# Patient Record
Sex: Female | Born: 1985 | Race: White | Hispanic: No | Marital: Single | Smoking: Current every day smoker
Health system: Southern US, Community
[De-identification: ages and names within clinical notes are randomized; demographics above are authoritative.]

## PROBLEM LIST (undated history)

## (undated) DIAGNOSIS — G709 Myoneural disorder, unspecified: Secondary | ICD-10-CM

## (undated) DIAGNOSIS — K219 Gastro-esophageal reflux disease without esophagitis: Secondary | ICD-10-CM

## (undated) DIAGNOSIS — E8801 Alpha-1-antitrypsin deficiency: Secondary | ICD-10-CM

## (undated) HISTORY — PX: TUBAL LIGATION: SHX77

## (undated) HISTORY — PX: CHOLECYSTECTOMY: SHX55

## (undated) HISTORY — PX: INDUCED ABORTION: SHX677

## (undated) HISTORY — PX: TONSILLECTOMY: SUR1361

---
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
## 2005-01-07 ENCOUNTER — Emergency Department: Payer: Self-pay | Admitting: Emergency Medicine

## 2005-08-15 ENCOUNTER — Emergency Department: Payer: Self-pay | Admitting: Emergency Medicine

## 2005-09-14 ENCOUNTER — Emergency Department: Payer: Self-pay | Admitting: Emergency Medicine

## 2005-11-05 ENCOUNTER — Emergency Department: Payer: Self-pay | Admitting: Emergency Medicine

## 2006-08-10 ENCOUNTER — Emergency Department: Payer: Self-pay

## 2006-09-19 ENCOUNTER — Emergency Department: Payer: Self-pay | Admitting: Emergency Medicine

## 2006-12-28 ENCOUNTER — Emergency Department: Payer: Self-pay | Admitting: Internal Medicine

## 2007-02-19 ENCOUNTER — Emergency Department: Payer: Self-pay | Admitting: Unknown Physician Specialty

## 2007-04-24 ENCOUNTER — Emergency Department: Payer: Self-pay | Admitting: Emergency Medicine

## 2007-06-15 ENCOUNTER — Emergency Department: Payer: Self-pay | Admitting: Emergency Medicine

## 2007-06-20 ENCOUNTER — Emergency Department: Payer: Self-pay | Admitting: Internal Medicine

## 2007-09-17 ENCOUNTER — Other Ambulatory Visit: Payer: Self-pay

## 2007-09-17 ENCOUNTER — Emergency Department: Payer: Self-pay | Admitting: Emergency Medicine

## 2009-10-09 ENCOUNTER — Emergency Department: Payer: Self-pay | Admitting: Emergency Medicine

## 2010-01-08 ENCOUNTER — Emergency Department: Payer: Self-pay | Admitting: Emergency Medicine

## 2010-02-28 ENCOUNTER — Emergency Department: Payer: Self-pay | Admitting: Emergency Medicine

## 2010-07-25 ENCOUNTER — Emergency Department: Payer: Self-pay | Admitting: Emergency Medicine

## 2010-11-28 ENCOUNTER — Ambulatory Visit: Payer: Self-pay | Admitting: Emergency Medicine

## 2010-12-04 ENCOUNTER — Ambulatory Visit: Payer: Self-pay | Admitting: Emergency Medicine

## 2010-12-09 LAB — PATHOLOGY REPORT

## 2011-02-14 ENCOUNTER — Emergency Department: Payer: Self-pay | Admitting: Emergency Medicine

## 2012-01-03 ENCOUNTER — Emergency Department: Payer: Self-pay | Admitting: Emergency Medicine

## 2012-04-25 ENCOUNTER — Emergency Department: Payer: Self-pay | Admitting: Emergency Medicine

## 2012-04-25 LAB — RAPID INFLUENZA A&B ANTIGENS

## 2012-09-13 ENCOUNTER — Emergency Department: Payer: Self-pay | Admitting: Emergency Medicine

## 2012-10-30 ENCOUNTER — Emergency Department: Payer: Self-pay | Admitting: Emergency Medicine

## 2015-01-15 ENCOUNTER — Emergency Department: Payer: Medicaid Other

## 2015-01-15 ENCOUNTER — Encounter: Payer: Self-pay | Admitting: Emergency Medicine

## 2015-01-15 ENCOUNTER — Emergency Department
Admission: EM | Admit: 2015-01-15 | Discharge: 2015-01-15 | Disposition: A | Discharge status: Home / Self Care | Payer: Medicaid Other | Attending: Emergency Medicine | Admitting: Emergency Medicine

## 2015-01-15 DIAGNOSIS — J159 Unspecified bacterial pneumonia: Secondary | ICD-10-CM | POA: Insufficient documentation

## 2015-01-15 DIAGNOSIS — J189 Pneumonia, unspecified organism: Secondary | ICD-10-CM

## 2015-01-15 DIAGNOSIS — R05 Cough: Secondary | ICD-10-CM | POA: Diagnosis present

## 2015-01-15 DIAGNOSIS — Z720 Tobacco use: Secondary | ICD-10-CM | POA: Insufficient documentation

## 2015-01-15 MED ORDER — AZITHROMYCIN 500 MG PO TABS: 500.0000 mg | ORAL_TABLET | Freq: Every day | ORAL | Status: AC

## 2015-01-15 MED ORDER — IPRATROPIUM-ALBUTEROL 0.5-2.5 (3) MG/3ML IN SOLN
3.0000 mL | Freq: Once | RESPIRATORY_TRACT | Status: AC
  Administered 2015-01-15: 3 mL via RESPIRATORY_TRACT
  Filled 2015-01-15: qty 3

## 2015-01-15 NOTE — Discharge Instructions (Signed)

## 2015-01-15 NOTE — ED Notes (Signed)
Pt to ed with c/o cough and congestion x 6 days.  Pt reports she has tried OTCs without relief.

## 2015-01-15 NOTE — ED Provider Notes (Signed)
[facility] Emergency Department Provider Note  ____________________________________________  Time seen: On arrival  I have reviewed the triage vital signs and the nursing notes.   HISTORY  Chief Complaint Cough    HPI Raven Christensen is a 29 y.o. female who complains of severe cough and congestion for 3-4 days. She reports night sweats . But has not checked her temperature. She reports at times his coughing is severe that it makes her nauseous. She denies shortness of breath. She does smoke cigarettes.     History reviewed. No pertinent past medical history.  There are no active problems to display for this patient.   History reviewed. No pertinent past surgical history.  Current Outpatient Rx  Name  Route  Sig  Dispense  Refill  . azithromycin (ZITHROMAX) 500 MG tablet   Oral   Take 1 tablet (500 mg total) by mouth daily. Take 1 tablet daily for 3 days.   7 tablet   0     Allergies Review of patient's allergies indicates no known allergies.  History reviewed. No pertinent family history.  Social History Social History  Substance Use Topics  . Smoking status: Current Every Day Smoker  . Smokeless tobacco: None  . Alcohol Use: No    Review of Systems  Constitutional: Negative for fever. Eyes: Negative for visual changes. ENT: Negative for sore throat Cardiovascular: Negative for chest pain. Respiratory: Negative for shortness of breath. Positive for cough Gastrointestinal: Negative for abdominal pain, vomiting and diarrhea. Genitourinary: Negative for dysuria. Musculoskeletal: Negative for back pain. Skin: Negative for rash. Neurological: Negative for headaches or focal weakness Psychiatric: No anxiety    ____________________________________________   PHYSICAL EXAM:  VITAL SIGNS: ED Triage Vitals  Enc Vitals Group     BP --      Pulse Rate 01/15/15 1418 96     Resp 01/15/15 1418 18     Temp 01/15/15 1418 98.2 F  (36.8 C)     Temp Source 01/15/15 1418 Oral     SpO2 01/15/15 1418 96 %     Weight 01/15/15 1418 234 lb (106.142 kg)     Height 01/15/15 1418  (1.702 m)     Head Cir --      Peak Flow --      Pain Score 01/15/15 1419 7     Pain Loc --      Pain Edu? --      Excl. in GC? --      Constitutional: Alert and oriented. Well appearing and in no distress. Eyes: Conjunctivae are normal.  ENT   Head: Normocephalic and atraumatic.   Mouth/Throat: Mucous membranes are moist. Cardiovascular: Normal rate, regular rhythm. Normal and symmetric distal pulses are present in all extremities. No murmurs, rubs, or gallops. Respiratory: Normal respiratory effort without tachypnea nor retractions. Breath sounds are clear and equal bilaterally.  Gastrointestinal: Soft and non-tender in all quadrants. No distention. There is no CVA tenderness. Genitourinary: deferred Musculoskeletal: Nontender with normal range of motion in all extremities. No lower extremity tenderness nor edema. Neurologic:  Normal speech and language. No gross focal neurologic deficits are appreciated. Skin:  Skin is warm, dry and intact. No rash noted. Psychiatric: Mood and affect are normal. Patient exhibits appropriate insight and judgment.  ____________________________________________    LABS (pertinent positives/negatives)  Labs Reviewed - No data to display  ____________________________________________   EKG  None  ____________________________________________    RADIOLOGY I have personally reviewed any xrays that were  ordered on this patient: Chest x-ray concerning for pneumonia  ____________________________________________   PROCEDURES  Procedure(s) performed: none  Critical Care performed: none  ____________________________________________   INITIAL IMPRESSION / ASSESSMENT AND PLAN / ED COURSE  Pertinent labs & imaging results that were available during my care of the patient were reviewed  by me and considered in my medical decision making (see chart for details).  Patient with significant cough and night sweats. She does have congestion which is worse just a viral etiology but given night sweats we will check a chest x-ray. In the meantime we will give her a DuoNeb for symptom relief.  Chest x-ray consistent with pneumonia. I will prescribe antibiotics for outpatient treatment given unremarkable vitals and nontoxic appearance    ____________________________________________   FINAL CLINICAL IMPRESSION(S) / ED DIAGNOSES  Final diagnoses:  Community acquired pneumonia     Jene Every, MD 01/15/15 [phone]

## 2015-08-14 ENCOUNTER — Encounter: Payer: Self-pay | Admitting: Emergency Medicine

## 2015-08-14 ENCOUNTER — Emergency Department
Admission: EM | Admit: 2015-08-14 | Discharge: 2015-08-14 | Disposition: A | Discharge status: Home / Self Care | Payer: Medicaid Other | Attending: Emergency Medicine | Admitting: Emergency Medicine

## 2015-08-14 DIAGNOSIS — Z9851 Tubal ligation status: Secondary | ICD-10-CM | POA: Insufficient documentation

## 2015-08-14 DIAGNOSIS — F17210 Nicotine dependence, cigarettes, uncomplicated: Secondary | ICD-10-CM | POA: Insufficient documentation

## 2015-08-14 DIAGNOSIS — R197 Diarrhea, unspecified: Secondary | ICD-10-CM | POA: Diagnosis present

## 2015-08-14 DIAGNOSIS — K529 Noninfective gastroenteritis and colitis, unspecified: Secondary | ICD-10-CM

## 2015-08-14 LAB — POCT PREGNANCY, URINE: Preg Test, Ur: NEGATIVE

## 2015-08-14 LAB — COMPREHENSIVE METABOLIC PANEL
ALBUMIN: 4 g/dL (ref 3.5–5.0)
ALK PHOS: 63 U/L (ref 38–126)
ALT: 58 U/L — ABNORMAL HIGH (ref 14–54)
ANION GAP: 7 (ref 5–15)
AST: 25 U/L (ref 15–41)
BILIRUBIN TOTAL: 0.2 mg/dL — AB (ref 0.3–1.2)
BUN: 7 mg/dL (ref 6–20)
CALCIUM: 8.6 mg/dL — AB (ref 8.9–10.3)
CO2: 29 mmol/L (ref 22–32)
Chloride: 106 mmol/L (ref 101–111)
Creatinine, Ser: 0.7 mg/dL (ref 0.44–1.00)
GFR calc Af Amer: >60 mL/min (ref >60)
GLUCOSE: 90 mg/dL (ref 65–99)
POTASSIUM: 3.7 mmol/L (ref 3.5–5.1)
Sodium: 142 mmol/L (ref 135–145)
TOTAL PROTEIN: 6.6 g/dL (ref 6.5–8.1)

## 2015-08-14 LAB — CBC
HCT: 39.8 % (ref 35.0–47.0)
Hemoglobin: 13.7 g/dL (ref 12.0–16.0)
MCH: 35 pg — ABNORMAL HIGH (ref 26.0–34.0)
MCHC: 34.4 g/dL (ref 32.0–36.0)
MCV: 101.7 fL — ABNORMAL HIGH (ref 80.0–100.0)
Platelets: 278 10*3/uL (ref 150–440)
RBC: 3.91 MIL/uL (ref 3.80–5.20)
RDW: 12.7 % (ref 11.5–14.5)
WBC: 7 10*3/uL (ref 3.6–11.0)

## 2015-08-14 LAB — URINALYSIS COMPLETE WITH MICROSCOPIC (ARMC ONLY)
BILIRUBIN URINE: NEGATIVE
GLUCOSE, UA: NEGATIVE mg/dL
HGB URINE DIPSTICK: NEGATIVE
KETONES UR: NEGATIVE mg/dL
LEUKOCYTES UA: NEGATIVE
NITRITE: NEGATIVE
PH: 8 (ref 5.0–8.0)
Protein, ur: NEGATIVE mg/dL
SPECIFIC GRAVITY, URINE: 1.01 (ref 1.005–1.030)

## 2015-08-14 LAB — LIPASE, BLOOD: LIPASE: 14 U/L (ref 11–51)

## 2015-08-14 NOTE — ED Provider Notes (Signed)
[facility] Emergency Department Provider Note  ____________________________________________  Time seen: Approximately 9:13 PM  I have reviewed the triage vital signs and the nursing notes.   HISTORY  Chief Complaint Diarrhea    HPI Raven Christensen is a 30 y.o. female with no specific past medical history other than obesity presents for evaluation of diarrhea that she states is been going on for at least 2 months.  She says that it occurs daily, consists of a couple of watery stools every day, and occasionally has mild lower abdominal cramping.  She sometimes has nausea but very rarely has vomiting.  She denies any upper abdominal pain.  She denies fever/chills, chest pain, shortness of breath, dysuria. She has a primary care doctor but has not seen them in about 4 years.  She has no family members with Crohn's disease or ulcerative colitis.  She came in today because she is just tired of it and has been feeling fatigued recently.  She has been taking Imodium but it is not helped.  She has also tried Pepto-Bismol but also with no positive results.  Overall she describes the symptoms as severe and nothing is making them better or worse.   History reviewed. No pertinent past medical history.  There are no active problems to display for this patient.   Past Surgical History  Procedure Laterality Date  . Induced abortion    . Tonsillectomy    . Tubal ligation      No current outpatient prescriptions on file.  Allergies Review of patient's allergies indicates no known allergies.  No family history on file.  Social History Social History  Substance Use Topics  . Smoking status: Current Every Day Smoker -- 0.50 packs/day    Types: Cigarettes  . Smokeless tobacco: None  . Alcohol Use: No    Review of Systems Constitutional: No fever/chills Eyes: No visual changes. ENT: No sore throat. Cardiovascular: Denies chest pain. Respiratory: Denies  shortness of breath. Gastrointestinal: No abdominal pain, occasional cramps.  No nausea, no vomiting.  +diarrhea.  No constipation. Genitourinary: Negative for dysuria. Musculoskeletal: Negative for back pain. Skin: Negative for rash. Neurological: Negative for headaches, focal weakness or numbness.  10-point ROS otherwise negative.  ____________________________________________   PHYSICAL EXAM:  VITAL SIGNS: ED Triage Vitals  Enc Vitals Group     BP 08/14/15 1910 128/68 mmHg     Pulse Rate 08/14/15 1910 89     Resp --      Temp 08/14/15 1910 98.3 F (36.8 C)     Temp Source 08/14/15 1910 Oral     SpO2 08/14/15 1910 100 %     Weight 08/14/15 1910 209 lb (94.802 kg)     Height 08/14/15 1910  (1.702 m)     Head Cir --      Peak Flow --      Pain Score 08/14/15 1912 5     Pain Loc --      Pain Edu? --      Excl. in GC? --     Constitutional: Alert and oriented. Well appearing and in no acute distress. Eyes: Conjunctivae are normal. PERRL. EOMI. Head: Atraumatic. Nose: No congestion/rhinnorhea. Mouth/Throat: Mucous membranes are moist.  Oropharynx non-erythematous. Neck: No stridor.  No meningeal signs.   Cardiovascular: Normal rate, regular rhythm. Good peripheral circulation. Grossly normal heart sounds.   Respiratory: Normal respiratory effort.  No retractions. Lungs CTAB. Gastrointestinal: Soft and nontender. No distention.  Musculoskeletal: No lower extremity  tenderness nor edema. No gross deformities of extremities. Neurologic:  Normal speech and language. No gross focal neurologic deficits are appreciated.  Skin:  Skin is warm, dry and intact. No rash noted.   ____________________________________________   LABS (all labs ordered are listed, but only abnormal results are displayed)  Labs Reviewed  COMPREHENSIVE METABOLIC PANEL - Abnormal; Notable for the following:    Calcium 8.6 (*)    ALT 58 (*)    Total Bilirubin 0.2 (*)    All other components  within normal limits  CBC - Abnormal; Notable for the following:    MCV 101.7 (*)    MCH 35.0 (*)    All other components within normal limits  URINALYSIS COMPLETEWITH MICROSCOPIC ([facility] ONLY) - Abnormal; Notable for the following:    Color, Urine YELLOW (*)    APPearance CLEAR (*)    Bacteria, UA RARE (*)    Squamous Epithelial / LPF 0-5 (*)    All other components within normal limits  LIPASE, BLOOD  POC URINE PREG, ED  POCT PREGNANCY, URINE   ____________________________________________  EKG  None ____________________________________________  RADIOLOGY   No results found.  ____________________________________________   PROCEDURES  Procedure(s) performed: None  Critical Care performed: No ____________________________________________   INITIAL IMPRESSION / ASSESSMENT AND PLAN / ED COURSE  Pertinent labs & imaging results that were available during my care of the patient were reviewed by me and considered in my medical decision making (see chart for details).  Benign physical exam and laboratory workup.  I explained to the patient that her symptoms are chronic and that I do not believe she would benefit from any antibiotics.  She also mentioned that she has not been traveling, camping, or had any unusual food exposures before everything began.  I recommended that she follow up with her primary care doctor and discuss the possible benefit of referral to gastroenterology, but explained that her symptoms are most consistent with irritable bowel syndrome.  I encouraged her to retrieve information are provided and follow-up with her PCP.  I gave my usual and customary return precautions.     ____________________________________________  FINAL CLINICAL IMPRESSION(S) / ED DIAGNOSES  Final diagnoses:  Chronic diarrhea      NEW MEDICATIONS STARTED DURING THIS VISIT:  New Prescriptions   No medications on file      Note:  This document was prepared using Dragon  voice recognition software and may include unintentional dictation errors.   Loleta Roseory Garlin Batdorf, MD 08/14/15 2129

## 2015-08-14 NOTE — Discharge Instructions (Signed)
As we discussed, your workup was reassuring today.  Given that this is a chronic issue, we recommend that you discuss with your primary care doctor and see if they have additional outpatient testing that they would like to pursue, or possibly refer you to a gastroenterologist after those options have been exhausted.  Please read through the information below for additional suggestions and recommendations.  Chronic Diarrhea Diarrhea is frequent loose and watery bowel movements. It can cause you to feel weak and dehydrated. Dehydration can cause you to become tired and thirsty and to have a dry mouth, decreased urination, and dark yellow urine. Diarrhea is a sign of another problem, most often an infection that will not last long. In most cases, diarrhea lasts 2-3 days. Diarrhea that lasts longer than 4 weeks is called long-lasting (chronic) diarrhea. It is important to treat your diarrhea as directed by your health care provider to lessen or prevent future episodes of diarrhea.  CAUSES  There are many causes of chronic diarrhea. The following are some possible causes:   Gastrointestinal infections caused by viruses, bacteria, or parasites.   Food poisoning or food allergies.   Certain medicines, such as antibiotics, chemotherapy, and laxatives.   Artificial sweeteners and fructose.   Digestive disorders, such as celiac disease and inflammatory bowel diseases.   Irritable bowel syndrome.  Some disorders of the pancreas.  Disorders of the thyroid.  Reduced blood flow to the intestines.  Cancer. Sometimes the cause of chronic diarrhea is unknown. RISK FACTORS  Having a severely weakened immune system, such as from HIV or AIDS.   Taking certain types of cancer-fighting drugs (such as with chemotherapy) or other medicines.   Having had a recent organ transplant.   Having a portion of the stomach or small bowel removed.   Traveling to countries where food and water supplies are  often contaminated.  SYMPTOMS  In addition to frequent, loose stools, diarrhea may cause:   Cramping.   Abdominal pain.   Nausea.   Fever.  Fatigue.  Urgent need to use the bathroom.  Loss of bowel control. DIAGNOSIS  Your health care provider must take a careful history and perform a physical exam. Tests given are based on your symptoms and history. Tests may include:   Blood or stool tests. Three or more stool samples may be examined. Stool cultures may be used to test for bacteria or parasites.   X-rays.   A procedure in which a thin tube is inserted into the mouth or rectum (endoscopy). This allows the health care provider to look inside the intestine.  TREATMENT   Treatment is aimed at correcting the cause of the diarrhea when possible.  Diarrhea caused by an infection can often be treated with antibiotic medicines.  Diarrhea not caused by an infection may require you to take long-term medicine or have surgery. Specific treatment should be discussed with your health care provider.  If the cause cannot be determined, treatment aims to relieve symptoms and prevent dehydration. Serious health problems can occur if you do not maintain proper fluid levels. Treatment may include:  Taking an oral rehydration solution (ORS).  Not drinking beverages that contain caffeine (such as tea, coffee, and soft drinks).  Not drinking alcohol.  Maintaining well-balanced nutrition to help you recover faster. HOME CARE INSTRUCTIONS   Drink enough fluids to keep urine clear or pale yellow. Drink 1 cup (8 oz) of fluid for each diarrhea episode. Avoid fluids that contain simple sugars, fruit juices, whole  milk products, and sodas. Hydrate with an ORS. You may purchase the ORS or prepare it at home by mixing the following ingredients together:   - tsp (1.7-3  mL) table salt.   tsp (3  mL) baking soda.   tsp (1.7 mL) salt substitute containing potassium chloride.  1 tbsp (20  mL) sugar.  4.2 c (1 L) of water.   Certain foods and beverages may increase the speed at which food moves through the gastrointestinal (GI) tract. These foods and beverages should be avoided. They include:  Caffeinated and alcoholic beverages.  High-fiber foods, such as raw fruits and vegetables, nuts, seeds, and whole grain breads and cereals.  Foods and beverages sweetened with sugar alcohols, such as xylitol, sorbitol, and mannitol.   Some foods may be well tolerated and may help thicken stool. These include:  Starchy foods, such as rice, toast, pasta, low-sugar cereal, oatmeal, grits, baked potatoes, crackers, and bagels.  Bananas.  Applesauce.  Add probiotic-rich foods to help increase healthy bacteria in the GI tract. These include yogurt and fermented milk products.  Wash your hands well after each diarrhea episode.  Only take over-the-counter or prescription medicines as directed by your health care provider.  Take a warm bath to relieve any burning or pain from frequent diarrhea episodes. SEEK MEDICAL CARE IF:   You are not urinating as often.  Your urine is a dark color.  You become very tired or dizzy.  You have severe pain in the abdomen or rectum.  Your have blood or pus in your stools.  Your stools look black and tarry. SEEK IMMEDIATE MEDICAL CARE IF:   You are unable to keep fluids down.  You have persistent vomiting.  You have blood in your stool.  Your stools are black and tarry.  You do not urinate in 6-8 hours, or there is only a small amount of very dark urine.  You have abdominal pain that increases or localizes.  You have weakness, dizziness, confusion, or lightheadedness.  You have a severe headache.  Your diarrhea gets worse or does not get better.  You have a fever or persistent symptoms for more than 2-3 days.  You have a fever and your symptoms suddenly get worse. MAKE SURE YOU:   Understand these instructions.  Will  watch your condition.  Will get help right away if you are not doing well or get worse.   This information is not intended to replace advice given to you by your health care provider. Make sure you discuss any questions you have with your health care provider.   Document Released: 07/04/2003 Document Revised: 04/18/2013 Document Reviewed: 10/06/2012 Elsevier Interactive Patient Education 2016 [employer].  Food Choices to Help Relieve Diarrhea, Adult When you have diarrhea, the foods you eat and your eating habits are very important. Choosing the right foods and drinks can help relieve diarrhea. Also, because diarrhea can last up to 7 days, you need to replace lost fluids and electrolytes (such as sodium, potassium, and chloride) in order to help prevent dehydration.  WHAT GENERAL GUIDELINES DO I NEED TO FOLLOW?  Slowly drink 1 cup (8 oz) of fluid for each episode of diarrhea. If you are getting enough fluid, your urine will be clear or pale yellow.  Eat starchy foods. Some good choices include white rice, white toast, pasta, low-fiber cereal, baked potatoes (without the skin), saltine crackers, and bagels.  Avoid large servings of any cooked vegetables.  Limit fruit to two servings per  day. A serving is  cup or 1 small piece.  Choose foods with less than 2 g of fiber per serving.  Limit fats to less than 8 tsp (38 g) per day.  Avoid fried foods.  Eat foods that have probiotics in them. Probiotics can be found in certain dairy products.  Avoid foods and beverages that may increase the speed at which food moves through the stomach and intestines (gastrointestinal tract). Things to avoid include:  High-fiber foods, such as dried fruit, raw fruits and vegetables, nuts, seeds, and whole grain foods.  Spicy foods and high-fat foods.  Foods and beverages sweetened with high-fructose corn syrup, honey, or sugar alcohols such as xylitol, sorbitol, and mannitol. WHAT FOODS ARE  RECOMMENDED? Grains White rice. White, [place], or pita breads (fresh or toasted), including plain rolls, buns, or bagels. White pasta. Saltine, soda, or graham crackers. Pretzels. Low-fiber cereal. Cooked cereals made with water (such as cornmeal, farina, or cream cereals). Plain muffins. Matzo. Melba toast. Zwieback.  Vegetables Potatoes (without the skin). Strained tomato and vegetable juices. Most well-cooked and canned vegetables without seeds. Tender lettuce. Fruits Cooked or canned applesauce, apricots, cherries, fruit cocktail, grapefruit, peaches, pears, or plums. Fresh bananas, apples without skin, cherries, grapes, cantaloupe, grapefruit, peaches, oranges, or plums.  Meat and Other Protein Products Baked or boiled chicken. Eggs. Tofu. Fish. Seafood. Smooth peanut butter. Ground or well-cooked tender beef, ham, veal, lamb, pork, or poultry.  Dairy Plain yogurt, kefir, and unsweetened liquid yogurt. Lactose-free milk, buttermilk, or soy milk. Plain hard cheese. Beverages Sport drinks. Clear broths. Diluted fruit juices (except prune). Regular, caffeine-free sodas such as ginger ale. Water. Decaffeinated teas. Oral rehydration solutions. Sugar-free beverages not sweetened with sugar alcohols. Other Bouillon, broth, or soups made from recommended foods.  The items listed above may not be a complete list of recommended foods or beverages. Contact your dietitian for more options. WHAT FOODS ARE NOT RECOMMENDED? Grains Whole grain, whole wheat, bran, or rye breads, rolls, pastas, crackers, and cereals. Wild or brown rice. Cereals that contain more than 2 g of fiber per serving. Corn tortillas or taco shells. Cooked or dry oatmeal. Granola. Popcorn. Vegetables Raw vegetables. Cabbage, broccoli, Brussels sprouts, artichokes, baked beans, beet greens, corn, kale, legumes, peas, sweet potatoes, and yams. Potato skins. Cooked spinach and cabbage. Fruits Dried fruit, including raisins and dates.  Raw fruits. Stewed or dried prunes. Fresh apples with skin, apricots, mangoes, pears, raspberries, and strawberries.  Meat and Other Protein Products Chunky peanut butter. Nuts and seeds. Beans and lentils. Tomasa Blase.  Dairy High-fat cheeses. Milk, chocolate milk, and beverages made with milk, such as milk shakes. Cream. Ice cream. Sweets and Desserts Sweet rolls, doughnuts, and sweet breads. Pancakes and waffles. Fats and Oils Butter. Cream sauces. Margarine. Salad oils. Plain salad dressings. Olives. Avocados.  Beverages Caffeinated beverages (such as coffee, tea, soda, or energy drinks). Alcoholic beverages. Fruit juices with pulp. Prune juice. Soft drinks sweetened with high-fructose corn syrup or sugar alcohols. Other Coconut. Hot sauce. Chili powder. Mayonnaise. Gravy. Cream-based or milk-based soups.  The items listed above may not be a complete list of foods and beverages to avoid. Contact your dietitian for more information. WHAT SHOULD I DO IF I BECOME DEHYDRATED? Diarrhea can sometimes lead to dehydration. Signs of dehydration include dark urine and dry mouth and skin. If you think you are dehydrated, you should rehydrate with an oral rehydration solution. These solutions can be purchased at pharmacies, retail stores, or online.   f oral  rehydration solution each time you have an episode of diarrhea. If drinking this amount makes your diarrhea worse, try drinking smaller amounts more often. For example, drink 1-3 tsp (5-15 mL) every 5-10 minutes.  A general rule for staying hydrated is to drink 1-2 L of fluid per day. Talk to your health care provider about the specific amount you should be drinking each day. Drink enough fluids to keep your urine clear or pale yellow.   This information is not intended to replace advice given to you by your health care provider. Make sure you discuss any questions you have with your health care provider.   Document Released: 07/04/2003 Document  Revised: 05/04/2014 Document Reviewed: 03/06/2013 Elsevier Interactive Patient Education [employer]2016 Elsevier Inc.

## 2015-08-14 NOTE — ED Notes (Signed)
Patient ambulatory to triage with steady gait, without difficulty or distress noted; pt reports diarrhea x 2 months with abd cramping; st increased episodes with meals

## 2016-04-24 ENCOUNTER — Emergency Department: Payer: Medicaid Other

## 2016-04-24 ENCOUNTER — Encounter: Payer: Self-pay | Admitting: Emergency Medicine

## 2016-04-24 ENCOUNTER — Emergency Department
Admission: EM | Admit: 2016-04-24 | Discharge: 2016-04-24 | Disposition: A | Discharge status: Home / Self Care | Payer: Medicaid Other | Attending: Emergency Medicine | Admitting: Emergency Medicine

## 2016-04-24 DIAGNOSIS — F17210 Nicotine dependence, cigarettes, uncomplicated: Secondary | ICD-10-CM | POA: Diagnosis not present

## 2016-04-24 DIAGNOSIS — J069 Acute upper respiratory infection, unspecified: Secondary | ICD-10-CM

## 2016-04-24 DIAGNOSIS — Z79899 Other long term (current) drug therapy: Secondary | ICD-10-CM | POA: Insufficient documentation

## 2016-04-24 DIAGNOSIS — R0602 Shortness of breath: Secondary | ICD-10-CM | POA: Diagnosis present

## 2016-04-24 MED ORDER — IPRATROPIUM-ALBUTEROL 0.5-2.5 (3) MG/3ML IN SOLN
RESPIRATORY_TRACT | Status: AC
  Administered 2016-04-24: 3 mL
  Filled 2016-04-24: qty 3

## 2016-04-24 MED ORDER — PREDNISONE 50 MG PO TABS: 50.0000 mg | ORAL_TABLET | Freq: Every day | ORAL | 0 refills | Status: DC

## 2016-04-24 MED ORDER — IPRATROPIUM-ALBUTEROL 0.5-2.5 (3) MG/3ML IN SOLN
3.0000 mL | Freq: Once | RESPIRATORY_TRACT | Status: AC
  Administered 2016-04-24: 3 mL via RESPIRATORY_TRACT
  Filled 2016-04-24: qty 3

## 2016-04-24 MED ORDER — PREDNISONE 20 MG PO TABS
60.0000 mg | ORAL_TABLET | Freq: Once | ORAL | Status: AC
  Administered 2016-04-24: 60 mg via ORAL
  Filled 2016-04-24: qty 3

## 2016-04-24 NOTE — ED Notes (Signed)
Pt states that she is having some relief from the breathing treatment, pharmacy tech at bedside, no distress noted

## 2016-04-24 NOTE — ED Triage Notes (Signed)
Pt in with co shob since this am, no recent illness. Pt in with runny nose, congestion, chills, and cough.

## 2016-04-24 NOTE — ED Provider Notes (Signed)
[facility] Emergency Department Provider Note   ____________________________________________    I have reviewed the triage vital signs and the nursing notes.   HISTORY  Chief Complaint Shortness of Breath     HPI Raven Christensen is a 30 y.o. female who presents with complaints of cough and shortness of breath. She reports 2 days of upper respiratory symptoms including congestion and mild sore throat and cough.while at work she felt short of breath she denies a history of asthma but she does smoke cigarettes. Also complains of chest tightness which she has never had before which has been present since midnight. No diaphoresis. No drug use.reports a history of alpha-1 antitrypsin   No past medical history on file.  There are no active problems to display for this patient.   Past Surgical History:  Procedure Laterality Date  . INDUCED ABORTION    . TONSILLECTOMY    . TUBAL LIGATION      Prior to Admission medications   Not on File     Allergies Patient has no known allergies.  No family history on file.  Social History Social History  Substance Use Topics  . Smoking status: Current Every Day Smoker    Packs/day: 0.50    Types: Cigarettes  . Smokeless tobacco: Not on file  . Alcohol use No    Review of Systems  Constitutional: No fever/chills Eyes: No visual changes.  ENT: as above Cardiovascular: Denies chest pain. Respiratory: as above Gastrointestinal: No abdominal pain.    Musculoskeletal: Negative for back pain. Skin: Negative for rash. Neurological: Negative for headaches   10-point ROS otherwise negative.  ____________________________________________   PHYSICAL EXAM:  VITAL SIGNS: ED Triage Vitals  Enc Vitals Group     BP 04/24/16 0435 132/72     Pulse Rate 04/24/16 0434 96     Resp 04/24/16 0434 20     Temp 04/24/16 0435 97.4 F (36.3 C)     Temp src --      SpO2 04/24/16 0434 100 %     Weight  04/24/16 0434 210 lb (95.3 kg)     Height 04/24/16 0434 5\' 7"  (1.702 m)     Head Circumference --      Peak Flow --      Pain Score 04/24/16 0655 4     Pain Loc --      Pain Edu? --      Excl. in GC? --     Constitutional: Alert and oriented. No acute distress. Pleasant and interactive Eyes: Conjunctivae are normal.  Head: Atraumatic.  Mouth/Throat: Mucous membranes are moist.   Neck:  Painless ROM Cardiovascular: Normal rate, regular rhythm. Grossly normal heart sounds.  Good peripheral circulation. Respiratory: Normal respiratory effort.  No retractions. Wheezing diffusely Gastrointestinal: Soft and nontender. No distention.  No CVA tenderness. Genitourinary: deferred Musculoskeletal: No lower extremity tenderness nor edema.  Warm and well perfused Neurologic:  Normal speech and language. No gross focal neurologic deficits are appreciated.  Skin:  Skin is warm, dry and intact. No rash noted. Psychiatric: Mood and affect are normal. Speech and behavior are normal.  ____________________________________________   LABS (all labs ordered are listed, but only abnormal results are displayed)  Labs Reviewed  CBC  COMPREHENSIVE METABOLIC PANEL  TROPONIN I   ____________________________________________  EKG  ED ECG REPORT I, Jene EveryKINNER, Joselle Deeds, the attending physician, personally viewed and interpreted this ECG.  Date: 04/24/2016 EKG Time: 8:32 AM Rate: 84 Rhythm: normal sinus  rhythm QRS Axis: normal Intervals: normal ST/T Wave abnormalities: normal Conduction Disturbances: none Narrative Interpretation: unremarkable  ____________________________________________  RADIOLOGY  Chest x-ray unremarkable ____________________________________________   PROCEDURES  Procedure(s) performed: No    Critical Care performed: No ____________________________________________   INITIAL IMPRESSION / ASSESSMENT AND PLAN / ED COURSE  Pertinent labs & imaging results that were  available during my care of the patient were reviewed by me and considered in my medical decision making (see chart for details).  Patient presents with symptoms of upper respiratory infection now with chest tightness and mild sob. She is wheezing on exam. We will tx with duoneb, po prednisone, check labs and cxr and reevaluate  Clinical Course   ----------------------------------------- 8:41 AM on 04/24/2016 -----------------------------------------  Patient reports feeling much better after DuoNeb. I will discharge her on prednisone for bronchospasm. Return precautions discussed. ____________________________________________   FINAL CLINICAL IMPRESSION(S) / ED DIAGNOSES  Final diagnoses:  Viral upper respiratory tract infection      NEW MEDICATIONS STARTED DURING THIS VISIT:  New Prescriptions   No medications on file     Note:  This document was prepared using Dragon voice recognition software and may include unintentional dictation errors.    Jene Everyobert Sharene Krikorian, MD 04/24/16 [phone]

## 2016-08-12 ENCOUNTER — Emergency Department
Admission: EM | Admit: 2016-08-12 | Discharge: 2016-08-12 | Disposition: A | Discharge status: Home / Self Care | Payer: Medicaid Other | Attending: Emergency Medicine | Admitting: Emergency Medicine

## 2016-08-12 ENCOUNTER — Emergency Department: Payer: Medicaid Other

## 2016-08-12 DIAGNOSIS — M62830 Muscle spasm of back: Secondary | ICD-10-CM | POA: Diagnosis not present

## 2016-08-12 DIAGNOSIS — F17210 Nicotine dependence, cigarettes, uncomplicated: Secondary | ICD-10-CM | POA: Insufficient documentation

## 2016-08-12 DIAGNOSIS — Z79899 Other long term (current) drug therapy: Secondary | ICD-10-CM | POA: Diagnosis not present

## 2016-08-12 DIAGNOSIS — M545 Low back pain: Secondary | ICD-10-CM | POA: Diagnosis present

## 2016-08-12 MED ORDER — ORPHENADRINE CITRATE 30 MG/ML IJ SOLN
60.0000 mg | Freq: Two times a day (BID) | INTRAMUSCULAR | Status: DC
  Administered 2016-08-12: 60 mg via INTRAMUSCULAR
  Filled 2016-08-12 (×2): qty 2

## 2016-08-12 MED ORDER — KETOROLAC TROMETHAMINE 60 MG/2ML IM SOLN
60.0000 mg | Freq: Once | INTRAMUSCULAR | Status: AC
  Administered 2016-08-12: 60 mg via INTRAMUSCULAR
  Filled 2016-08-12: qty 2

## 2016-08-12 MED ORDER — METHOCARBAMOL 750 MG PO TABS: 750.0000 mg | ORAL_TABLET | Freq: Four times a day (QID) | ORAL | 0 refills | Status: DC

## 2016-08-12 NOTE — Discharge Instructions (Signed)
Continue previous pain medications and start Robaxin as directed

## 2016-08-12 NOTE — ED Provider Notes (Signed)
[facility] Emergency Department Provider Note   ____________________________________________   First MD Initiated Contact with Patient 08/12/16 [phone]     (approximate)  I have reviewed the triage vital signs and the nursing notes.   HISTORY  Chief Complaint Back Pain    HPI Raven Christensen is a 31 y.o. female patient complain of low back pain for 2 weeks. Patient state history of chronic back pain. Patient state increased back pain status post bending and lifting heavy boxes at work. Patient state now has muscle spasm in the back locks up immediately difficult to stand up fully. Patient denies radicular component to this pain. Patient denies bladder or bowel dysfunction.Review of narcotic Log revealed patient takes tramadol last prescription filled for 08/13/2016 with 2 refills.   No past medical history on file.  There are no active problems to display for this patient.   Past Surgical History:  Procedure Laterality Date  . INDUCED ABORTION    . TONSILLECTOMY    . TUBAL LIGATION      Prior to Admission medications   Medication Sig Start Date End Date Taking? Authorizing Provider  albuterol (PROVENTIL HFA;VENTOLIN HFA) 108 (90 Base) MCG/ACT inhaler Inhale 1-2 puffs into the lungs every 4 (four) hours as needed for wheezing or shortness of breath.    Historical Provider, MD  gabapentin (NEURONTIN) 300 MG capsule Take 300 mg by mouth at bedtime.    Historical Provider, MD  hydrochlorothiazide (MICROZIDE) 12.5 MG capsule Take 12.5 mg by mouth daily.    Historical Provider, MD  meloxicam (MOBIC) 7.5 MG tablet Take 7.5 mg by mouth daily.    Historical Provider, MD  methocarbamol (ROBAXIN-750) 750 MG tablet Take 1 tablet (750 mg total) by mouth 4 (four) times daily. 08/12/16   Joni Reining, PA-C  predniSONE (DELTASONE) 50 MG tablet Take 1 tablet (50 mg total) by mouth daily with breakfast. 04/24/16   Jene Every, MD  topiramate (TOPAMAX) 100 MG tablet  Take 100 mg by mouth daily.    Historical Provider, MD  traMADol (ULTRAM) 50 MG tablet Take 50 mg by mouth daily.    Historical Provider, MD    Allergies Patient has no known allergies.  No family history on file.  Social History Social History  Substance Use Topics  . Smoking status: Current Every Day Smoker    Packs/day: 0.50    Types: Cigarettes  . Smokeless tobacco: Not on file  . Alcohol use No    Review of Systems Constitutional: No fever/chills Eyes: No visual changes. ENT: No sore throat. Cardiovascular: Denies chest pain. Respiratory: Denies shortness of breath. Gastrointestinal: No abdominal pain.  No nausea, no vomiting.  No diarrhea.  No constipation. Genitourinary: Negative for dysuria. Musculoskeletal: Positive for back pain. Skin: Negative for rash. Neurological: Negative for headaches, focal weakness or numbness.    ____________________________________________   PHYSICAL EXAM:  VITAL SIGNS: ED Triage Vitals  Enc Vitals Group     BP 08/12/16 0708 117/69     Pulse Rate 08/12/16 0708 95     Resp 08/12/16 0708 17     Temp 08/12/16 0708 98.4 F (36.9 C)     Temp Source 08/12/16 0708 Oral     SpO2 08/12/16 0708 100 %     Weight 08/12/16 0708 206 lb (93.4 kg)     Height 08/12/16 0708  (1.702 m)     Head Circumference --      Peak Flow --  Pain Score 08/12/16 0707 7     Pain Loc --      Pain Edu? --      Excl. in GC? --     Constitutional: Alert and oriented. Well appearing and in no acute distress. Eyes: Conjunctivae are normal. PERRL. EOMI. Head: Atraumatic. Nose: No congestion/rhinnorhea. Mouth/Throat: Mucous membranes are moist.  Oropharynx non-erythematous. Neck: No stridor.  No cervical spine tenderness to palpation. Hematological/Lymphatic/Immunilogical: No cervical lymphadenopathy. Cardiovascular: Normal rate, regular rhythm. Grossly normal heart sounds.  Good peripheral circulation. Respiratory: Normal respiratory effort.  No  retractions. Lungs CTAB. Gastrointestinal: Soft and nontender. No distention. No abdominal bruits. No CVA tenderness. Musculoskeletal: Patient stands and a flexed position of the lumbar spine for exam. Patient has decreased range of motion with spinal extension limited by complaining of pain. Patient demonstrated moderate guarding palpation L3-S1. Patient has negative straight leg test. Neurologic:  Normal speech and language. No gross focal neurologic deficits are appreciated. No gait instability. Skin:  Skin is warm, dry and intact. No rash noted. Psychiatric: Mood and affect are normal. Speech and behavior are normal.  ____________________________________________   LABS (all labs ordered are listed, but only abnormal results are displayed)  Labs Reviewed - No data to display ____________________________________________  EKG   ____________________________________________  RADIOLOGY  Acute final x-ray lumbar spine. ____________________________________________   PROCEDURES  Procedure(s) performed: None  Procedures  Critical Care performed: No  ____________________________________________   INITIAL IMPRESSION / ASSESSMENT AND PLAN / ED COURSE  Pertinent labs & imaging results that were available during my care of the patient were reviewed by me and considered in my medical decision making (see chart for details).  Lumbar strain. X-rays pending. Patient given an injection of Norflex and Toradol.      ____________________________________________   FINAL CLINICAL IMPRESSION(S) / ED DIAGNOSES  Final diagnoses:  Muscle spasm of back  Discussed x-ray finding with patient. Patient given a prescription for Robaxin. Patient given discharge Instructions. Patient given a work note. Patient advised to follow-up with treating doctor for continued care.    NEW MEDICATIONS STARTED DURING THIS VISIT:  New Prescriptions   METHOCARBAMOL (ROBAXIN-750) 750 MG TABLET    Take 1  tablet (750 mg total) by mouth 4 (four) times daily.     Note:  This document was prepared using Dragon voice recognition software and may include unintentional dictation errors.    Joni Reining, PA-C 08/12/16 1610    Jeanmarie Plant, MD 08/12/16 [phone]

## 2016-08-12 NOTE — ED Notes (Signed)
Pt presents with back pain x 2 weeks. States she has chronic back pain but that this is worse than usual. She lifted boxes at work and now has spasms that "lock it up" and make it difficult to stand up fully. NAD noted.

## 2016-08-12 NOTE — ED Triage Notes (Signed)
Pt reports lower back pain for about 2 weeks since lifting crates at work. Pt reports pain worsened this am.

## 2016-08-12 NOTE — ED Notes (Signed)
Pt discharged home after verbalizing understanding of discharge instructions; nad noted. 

## 2017-05-06 ENCOUNTER — Other Ambulatory Visit: Payer: Self-pay

## 2017-05-06 ENCOUNTER — Emergency Department
Admission: EM | Admit: 2017-05-06 | Discharge: 2017-05-06 | Disposition: A | Discharge status: Home / Self Care | Payer: Medicaid Other | Attending: Emergency Medicine | Admitting: Emergency Medicine

## 2017-05-06 DIAGNOSIS — R1032 Left lower quadrant pain: Secondary | ICD-10-CM | POA: Insufficient documentation

## 2017-05-06 DIAGNOSIS — Z5321 Procedure and treatment not carried out due to patient leaving prior to being seen by health care provider: Secondary | ICD-10-CM | POA: Insufficient documentation

## 2017-05-06 LAB — COMPREHENSIVE METABOLIC PANEL
ALT: 40 U/L (ref 14–54)
ANION GAP: 9 (ref 5–15)
AST: 26 U/L (ref 15–41)
Albumin: 4.7 g/dL (ref 3.5–5.0)
Alkaline Phosphatase: 54 U/L (ref 38–126)
BUN: 8 mg/dL (ref 6–20)
CHLORIDE: 105 mmol/L (ref 101–111)
CO2: 28 mmol/L (ref 22–32)
Calcium: 9.1 mg/dL (ref 8.9–10.3)
Creatinine, Ser: 0.7 mg/dL (ref 0.44–1.00)
Glucose, Bld: 114 mg/dL — ABNORMAL HIGH (ref 65–99)
POTASSIUM: 3.9 mmol/L (ref 3.5–5.1)
SODIUM: 142 mmol/L (ref 135–145)
Total Bilirubin: 0.2 mg/dL — ABNORMAL LOW (ref 0.3–1.2)
Total Protein: 7.9 g/dL (ref 6.5–8.1)

## 2017-05-06 LAB — URINALYSIS, COMPLETE (UACMP) WITH MICROSCOPIC
BACTERIA UA: NONE SEEN
Bilirubin Urine: NEGATIVE
GLUCOSE, UA: NEGATIVE mg/dL
KETONES UR: NEGATIVE mg/dL
LEUKOCYTES UA: NEGATIVE
Nitrite: NEGATIVE
Protein, ur: NEGATIVE mg/dL
Specific Gravity, Urine: 1.02 (ref 1.005–1.030)
pH: 7.5 (ref 5.0–8.0)

## 2017-05-06 LAB — CBC
HEMATOCRIT: 45.1 % (ref 35.0–47.0)
HEMOGLOBIN: 15.6 g/dL (ref 12.0–16.0)
MCH: 35.6 pg — ABNORMAL HIGH (ref 26.0–34.0)
MCHC: 34.6 g/dL (ref 32.0–36.0)
MCV: 103.1 fL — AB (ref 80.0–100.0)
PLATELETS: 347 10*3/uL (ref 150–440)
RBC: 4.38 MIL/uL (ref 3.80–5.20)
RDW: 12.6 % (ref 11.5–14.5)
WBC: 7.8 10*3/uL (ref 3.6–11.0)

## 2017-05-06 LAB — LIPASE, BLOOD: LIPASE: 30 U/L (ref 11–51)

## 2017-05-06 NOTE — ED Triage Notes (Signed)
Pt presents to ED via POV from home with c/o LLQ abdominal pain since yesterday. Pt reports N/V/D; 4 episodes of emesis and 12 episodes of diarrhea over the last 24 hrs. Pt denies fever, CP, or SHOB. Pt also reports vaginal bleeding that started today; LMP was 23rd of December and pt reports having tubal ligation in February 2011. Pt denies known sick contacts. Pt is A&O, in NAD; RR even, regular, and unlabored; skin color/temp is WNL.

## 2017-05-07 ENCOUNTER — Telehealth: Payer: Self-pay | Admitting: Emergency Medicine

## 2017-05-07 LAB — POCT PREGNANCY, URINE: Preg Test, Ur: NEGATIVE
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
## 2017-09-08 ENCOUNTER — Ambulatory Visit
Admission: RE | Admit: 2017-09-08 | Discharge: 2017-09-08 | Disposition: A | Discharge status: Home / Self Care | Payer: Self-pay | Source: Ambulatory Visit | Attending: Physician Assistant | Admitting: Physician Assistant

## 2017-09-08 ENCOUNTER — Other Ambulatory Visit: Payer: Self-pay | Admitting: Physician Assistant

## 2017-09-08 DIAGNOSIS — E041 Nontoxic single thyroid nodule: Secondary | ICD-10-CM

## 2017-09-10 ENCOUNTER — Other Ambulatory Visit: Payer: Self-pay | Admitting: Physician Assistant

## 2017-09-10 DIAGNOSIS — E041 Nontoxic single thyroid nodule: Secondary | ICD-10-CM

## 2017-09-21 ENCOUNTER — Ambulatory Visit: Payer: Medicaid Other

## 2017-09-22 ENCOUNTER — Ambulatory Visit: Admission: RE | Admit: 2017-09-22 | Payer: Medicaid Other | Source: Ambulatory Visit

## 2017-09-22 ENCOUNTER — Ambulatory Visit
Admission: RE | Admit: 2017-09-22 | Discharge: 2017-09-22 | Disposition: A | Discharge status: Home / Self Care | Payer: Medicaid Other | Source: Ambulatory Visit | Attending: Physician Assistant | Admitting: Physician Assistant

## 2017-09-22 DIAGNOSIS — E041 Nontoxic single thyroid nodule: Secondary | ICD-10-CM | POA: Diagnosis not present

## 2017-09-22 HISTORY — DX: Myoneural disorder, unspecified: G70.9

## 2017-09-22 HISTORY — DX: Gastro-esophageal reflux disease without esophagitis: K21.9

## 2017-09-22 NOTE — Procedures (Signed)
Left inferior thyroid nodule  S/p [place] FNA  No comp Stable ebl 0 Path pending Full report in pacs

## 2017-09-23 LAB — CYTOLOGY - NON PAP

## 2017-11-24 ENCOUNTER — Other Ambulatory Visit: Payer: Self-pay | Admitting: Family Medicine

## 2017-11-24 DIAGNOSIS — N6452 Nipple discharge: Secondary | ICD-10-CM

## 2017-12-20 ENCOUNTER — Other Ambulatory Visit: Payer: Medicaid Other

## 2018-01-07 ENCOUNTER — Other Ambulatory Visit: Payer: Self-pay

## 2018-01-07 ENCOUNTER — Encounter: Payer: Self-pay | Admitting: Emergency Medicine

## 2018-01-07 ENCOUNTER — Emergency Department: Payer: Medicaid Other

## 2018-01-07 ENCOUNTER — Emergency Department
Admission: EM | Admit: 2018-01-07 | Discharge: 2018-01-07 | Disposition: A | Discharge status: Home / Self Care | Payer: Medicaid Other | Attending: Emergency Medicine | Admitting: Emergency Medicine

## 2018-01-07 DIAGNOSIS — M25512 Pain in left shoulder: Secondary | ICD-10-CM

## 2018-01-07 DIAGNOSIS — M542 Cervicalgia: Secondary | ICD-10-CM

## 2018-01-07 DIAGNOSIS — F17210 Nicotine dependence, cigarettes, uncomplicated: Secondary | ICD-10-CM | POA: Insufficient documentation

## 2018-01-07 DIAGNOSIS — Z79899 Other long term (current) drug therapy: Secondary | ICD-10-CM | POA: Insufficient documentation

## 2018-01-07 DIAGNOSIS — Z9049 Acquired absence of other specified parts of digestive tract: Secondary | ICD-10-CM | POA: Diagnosis not present

## 2018-01-07 MED ORDER — NAPROXEN 375 MG PO TABS: 375.0000 mg | ORAL_TABLET | Freq: Two times a day (BID) | ORAL | 0 refills | Status: DC
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
## 2018-01-07 NOTE — Discharge Instructions (Addendum)
The x-ray of your left clavicle does not show any fracture.  This is likely all soft tissue injury.  You can try ice for 10 minutes twice a day for swelling and pain.  I am providing you with a prescription for Naproxen 375 mg to be taken twice a day as needed with food.  Follow-up with your PCP as needed if symptoms persist or worsen
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
## 2018-03-19 ENCOUNTER — Encounter: Payer: Self-pay | Admitting: Emergency Medicine

## 2018-03-19 ENCOUNTER — Emergency Department
Admission: EM | Admit: 2018-03-19 | Discharge: 2018-03-19 | Disposition: A | Discharge status: Home / Self Care | Payer: Medicaid Other | Attending: Emergency Medicine | Admitting: Emergency Medicine

## 2018-03-19 ENCOUNTER — Other Ambulatory Visit: Payer: Self-pay

## 2018-03-19 DIAGNOSIS — B9789 Other viral agents as the cause of diseases classified elsewhere: Secondary | ICD-10-CM | POA: Diagnosis not present

## 2018-03-19 DIAGNOSIS — R05 Cough: Secondary | ICD-10-CM | POA: Diagnosis present

## 2018-03-19 DIAGNOSIS — F17210 Nicotine dependence, cigarettes, uncomplicated: Secondary | ICD-10-CM | POA: Insufficient documentation

## 2018-03-19 DIAGNOSIS — Z79899 Other long term (current) drug therapy: Secondary | ICD-10-CM | POA: Insufficient documentation

## 2018-03-19 DIAGNOSIS — J069 Acute upper respiratory infection, unspecified: Secondary | ICD-10-CM | POA: Insufficient documentation

## 2018-03-19 LAB — INFLUENZA PANEL BY PCR (TYPE A & B)
Influenza A By PCR: NEGATIVE
Influenza B By PCR: NEGATIVE

## 2018-03-19 MED ORDER — GUAIFENESIN-CODEINE 100-10 MG/5ML PO SYRP: 5.0000 mL | ORAL_SOLUTION | Freq: Three times a day (TID) | ORAL | 0 refills | Status: DC | PRN

## 2018-03-19 NOTE — ED Notes (Signed)
Pt verbalized understanding of discharge instructions. NAD at this time. 

## 2018-03-19 NOTE — ED Triage Notes (Signed)
Pt presents to ED with c/o cough and nasal congestion x 2 days. Pt states has had fevers at home, highest reported is 101. Pt states is taking Dayquil and Nyquil.

## 2018-03-19 NOTE — ED Provider Notes (Signed)
[facility] Emergency Department Provider Note  ____________________________________________  Time seen: Approximately 11:59 AM  I have reviewed the triage vital signs and the nursing notes.   HISTORY  Chief Complaint Cough and Nasal Congestion   HPI Raven Christensen is a 32 y.o. female who presents to the emergency department for treatment and evaluation of cough, fever, and sinus congestion that has been present for the past 2 days and getting worse.  No relief with DayQuil or NyQuil.   Past Medical History:  Diagnosis Date  . GERD (gastroesophageal reflux disease)   . Neuromuscular disorder (HCC)    undiagnosed right leg "nerve problem"    There are no active problems to display for this patient.   Past Surgical History:  Procedure Laterality Date  . CHOLECYSTECTOMY    . INDUCED ABORTION    . TONSILLECTOMY    . TUBAL LIGATION      Prior to Admission medications   Medication Sig Start Date End Date Taking? Authorizing Provider  albuterol (PROVENTIL HFA;VENTOLIN HFA) 108 (90 Base) MCG/ACT inhaler Inhale 1-2 puffs into the lungs every 4 (four) hours as needed for wheezing or shortness of breath.    [provider]  gabapentin (NEURONTIN) 300 MG capsule Take 300 mg by mouth at bedtime.    [provider]  guaiFENesin-codeine (ROBITUSSIN AC) 100-10 MG/5ML syrup Take 5 mLs by mouth 3 (three) times daily as needed for cough. 03/19/18   Keilin Gamboa B, FNP  hydrochlorothiazide (MICROZIDE) 12.5 MG capsule Take 12.5 mg by mouth daily.    [provider]  naproxen (NAPROSYN) 375 MG tablet Take 1 tablet (375 mg total) by mouth 2 (two) times daily with a meal. 01/07/18   Baity, Salvadore Oxford, NP  topiramate (TOPAMAX) 100 MG tablet Take 100 mg by mouth daily.    [provider]    Allergies Patient has no known allergies.  History reviewed. No pertinent family history.  Social History Social History   Tobacco Use  .  Smoking status: Current Every Day Smoker    Packs/day: 0.50    Types: Cigarettes  . Smokeless tobacco: Never Used  Substance Use Topics  . Alcohol use: No  . Drug use: No    Review of Systems Constitutional: Positive for fever/chills.  Negative for decreased appetite. ENT: Positive for sore throat. Cardiovascular: Denies chest pain. Respiratory: Negative for shortness of breath.  Positive for cough.  Negative wheezing.  Gastrointestinal: Negative for nausea, no vomiting.  Negative for diarrhea.  Musculoskeletal: Positive for body aches Skin: Negative for rash. Neurological: Positive for headaches ____________________________________________   PHYSICAL EXAM:  VITAL SIGNS: ED Triage Vitals  Enc Vitals Group     BP 03/19/18 1121 114/68     Pulse Rate 03/19/18 1121 (!) 101     Resp 03/19/18 1121 20     Temp 03/19/18 1121 98.4 F (36.9 C)     Temp Source 03/19/18 1121 Oral     SpO2 03/19/18 1121 97 %     Weight 03/19/18 1121 234 lb (106.1 kg)     Height 03/19/18 1121 5\' 6"  (1.676 m)     Head Circumference --      Peak Flow --      Pain Score 03/19/18 1120 0     Pain Loc --      Pain Edu? --      Excl. in GC? --     Constitutional: Alert and oriented.  Acutely ill appearing and in no  acute distress. Eyes: Conjunctivae are normal. Ears: Tympanic membranes are injected but without evidence of otitis media. Nose: Maxillary and frontal sinus congestion noted; clear rhinnorhea. Mouth/Throat: Mucous membranes are moist.  Oropharynx erythematous. Tonsils 1+ without exudate. Uvula midline. Neck: No stridor.  Lymphatic: Bilateral anterior cervical lymphadenopathy. Cardiovascular: Normal rate, regular rhythm. Good peripheral circulation. Respiratory: Respirations are even and unlabored.  No retractions.  Breath sounds are clear to auscultation. Gastrointestinal: Soft and nontender.  Musculoskeletal: FROM x 4 extremities.  Neurologic:  Normal speech and language. Skin:  Skin is  warm, dry and intact. No rash noted. Psychiatric: Mood and affect are normal. Speech and behavior are normal.  ____________________________________________   LABS (all labs ordered are listed, but only abnormal results are displayed)  Labs Reviewed  INFLUENZA PANEL BY PCR (TYPE A & B)   ____________________________________________  EKG  Not indicated ____________________________________________  RADIOLOGY  Not indicated ____________________________________________   PROCEDURES  Procedure(s) performed: None  Critical Care performed: No ____________________________________________   INITIAL IMPRESSION / ASSESSMENT AND PLAN / ED COURSE  32 y.o. female who presents to the emergency department for treatment and evaluation of symptoms and exam most consistent with viral process.  Influenza testing was negative.  The patient will be given a prescription for guaifenesin with codeine and advised to be off work for the next 2 days.  She was encouraged to follow-up with her primary care provider for symptoms that are not improving over the next few days.  She was instructed to return to the emergency department for symptoms of change or worsen if unable to schedule appointment.    Medications - No data to display  ED Discharge Orders         Ordered    guaiFENesin-codeine (ROBITUSSIN AC) 100-10 MG/5ML syrup  3 times daily PRN     03/19/18 1326           Pertinent labs & imaging results that were available during my care of the patient were reviewed by me and considered in my medical decision making (see chart for details).    If controlled substance prescribed during this visit, 12 month history viewed on the [facility] prior to issuing an initial prescription for Schedule II or III opiod. ____________________________________________   FINAL CLINICAL IMPRESSION(S) / ED DIAGNOSES  Final diagnoses:  Viral URI with cough    Note:  This document was prepared using Dragon  voice recognition software and may include unintentional dictation errors.     Chinita Pesterriplett, Reis Pienta B, FNP 03/19/18 1735    Jeanmarie PlantMcShane, James A, MD 03/20/18 [phone]

## 2018-03-19 NOTE — Discharge Instructions (Signed)
Please follow up with your primary care provider for symptoms that are not improving over the next few days.  Return to the ER for symptoms that change or worsen if unable to schedule an appointment. 

## 2019-02-20 ENCOUNTER — Encounter (HOSPITAL_COMMUNITY): Payer: Self-pay | Admitting: Emergency Medicine

## 2019-02-20 ENCOUNTER — Other Ambulatory Visit: Payer: Self-pay

## 2019-02-20 ENCOUNTER — Emergency Department (HOSPITAL_COMMUNITY)
Admission: EM | Admit: 2019-02-20 | Discharge: 2019-02-20 | Disposition: A | Discharge status: Home / Self Care | Payer: Medicaid Other | Attending: Emergency Medicine | Admitting: Emergency Medicine

## 2019-02-20 DIAGNOSIS — Z79899 Other long term (current) drug therapy: Secondary | ICD-10-CM | POA: Diagnosis not present

## 2019-02-20 DIAGNOSIS — R4182 Altered mental status, unspecified: Secondary | ICD-10-CM | POA: Diagnosis not present

## 2019-02-20 DIAGNOSIS — F17210 Nicotine dependence, cigarettes, uncomplicated: Secondary | ICD-10-CM | POA: Insufficient documentation

## 2019-02-20 DIAGNOSIS — R519 Headache, unspecified: Secondary | ICD-10-CM | POA: Insufficient documentation

## 2019-02-20 DIAGNOSIS — R404 Transient alteration of awareness: Secondary | ICD-10-CM

## 2019-02-20 HISTORY — DX: Alpha-1-antitrypsin deficiency: E88.01

## 2019-02-20 LAB — I-STAT BETA HCG BLOOD, ED (MC, WL, AP ONLY): I-stat hCG, quantitative: <5 m[IU]/mL (ref <5)

## 2019-02-20 LAB — CBC
HCT: 44.5 % (ref 36.0–46.0)
Hemoglobin: 15 g/dL (ref 12.0–15.0)
MCH: 35.1 pg — ABNORMAL HIGH (ref 26.0–34.0)
MCHC: 33.7 g/dL (ref 30.0–36.0)
MCV: 104.2 fL — ABNORMAL HIGH (ref 80.0–100.0)
Platelets: 306 10*3/uL (ref 150–400)
RBC: 4.27 MIL/uL (ref 3.87–5.11)
RDW: 11.9 % (ref 11.5–15.5)
WBC: 8.3 10*3/uL (ref 4.0–10.5)
nRBC: 0 % (ref 0.0–0.2)

## 2019-02-20 LAB — COMPREHENSIVE METABOLIC PANEL
ALT: 33 U/L (ref 0–44)
AST: 18 U/L (ref 15–41)
Albumin: 3.9 g/dL (ref 3.5–5.0)
Alkaline Phosphatase: 61 U/L (ref 38–126)
Anion gap: 10 (ref 5–15)
BUN: 6 mg/dL (ref 6–20)
CO2: 27 mmol/L (ref 22–32)
Calcium: 9.2 mg/dL (ref 8.9–10.3)
Chloride: 102 mmol/L (ref 98–111)
Creatinine, Ser: 0.75 mg/dL (ref 0.44–1.00)
GFR calc Af Amer: >60 mL/min (ref >60)
GFR calc non Af Amer: >60 mL/min (ref >60)
Glucose, Bld: 123 mg/dL — ABNORMAL HIGH (ref 70–99)
Potassium: 4.2 mmol/L (ref 3.5–5.1)
Sodium: 139 mmol/L (ref 135–145)
Total Bilirubin: 0.7 mg/dL (ref 0.3–1.2)
Total Protein: 6.8 g/dL (ref 6.5–8.1)

## 2019-02-20 MED ORDER — METOCLOPRAMIDE HCL 5 MG/ML IJ SOLN: 10.0000 mg | Freq: Once | INTRAMUSCULAR | Status: DC

## 2019-02-20 MED ORDER — DIPHENHYDRAMINE HCL 50 MG/ML IJ SOLN
12.5000 mg | Freq: Once | INTRAMUSCULAR | Status: DC
  Filled 2019-02-20: qty 1

## 2019-02-20 MED ORDER — METOCLOPRAMIDE HCL 5 MG/ML IJ SOLN
10.0000 mg | Freq: Once | INTRAMUSCULAR | Status: AC
  Administered 2019-02-20: 11:00:00 10 mg via INTRAMUSCULAR

## 2019-02-20 MED ORDER — METOCLOPRAMIDE HCL 5 MG/ML IJ SOLN
10.0000 mg | Freq: Once | INTRAMUSCULAR | Status: DC
  Filled 2019-02-20: qty 2

## 2019-02-20 MED ORDER — SODIUM CHLORIDE 0.9% FLUSH: 3.0000 mL | Freq: Once | INTRAVENOUS | Status: DC

## 2019-02-20 MED ORDER — SODIUM CHLORIDE 0.9 % IV BOLUS: 1000.0000 mL | Freq: Once | INTRAVENOUS | Status: DC
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
## 2019-02-20 NOTE — Discharge Instructions (Addendum)
We discussed our recommendations for a CT scan today to evaluate for cause of your episodes last night. Please follow up closely with your primary care provider regarding your visit today. Return to the ER for recurrent or worsening symptoms.
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
## 2019-02-20 NOTE — ED Notes (Signed)
Patient refusing IV, stating she has to leave to pick up her kids. Patient requesting PO or IM medication. PA notified.

## 2019-06-12 ENCOUNTER — Emergency Department: Payer: Medicaid Other

## 2019-06-12 ENCOUNTER — Encounter: Payer: Self-pay | Admitting: *Deleted

## 2019-06-12 ENCOUNTER — Other Ambulatory Visit: Payer: Self-pay

## 2019-06-12 ENCOUNTER — Emergency Department
Admission: EM | Admit: 2019-06-12 | Discharge: 2019-06-12 | Disposition: A | Discharge status: Home / Self Care | Payer: Medicaid Other | Attending: Emergency Medicine | Admitting: Emergency Medicine

## 2019-06-12 DIAGNOSIS — Y929 Unspecified place or not applicable: Secondary | ICD-10-CM | POA: Insufficient documentation

## 2019-06-12 DIAGNOSIS — S0990XA Unspecified injury of head, initial encounter: Secondary | ICD-10-CM | POA: Diagnosis present

## 2019-06-12 DIAGNOSIS — W503XXA Accidental bite by another person, initial encounter: Secondary | ICD-10-CM

## 2019-06-12 DIAGNOSIS — S61252A Open bite of right middle finger without damage to nail, initial encounter: Secondary | ICD-10-CM | POA: Diagnosis not present

## 2019-06-12 DIAGNOSIS — Y939 Activity, unspecified: Secondary | ICD-10-CM | POA: Insufficient documentation

## 2019-06-12 DIAGNOSIS — Y999 Unspecified external cause status: Secondary | ICD-10-CM | POA: Insufficient documentation

## 2019-06-12 DIAGNOSIS — Z79899 Other long term (current) drug therapy: Secondary | ICD-10-CM | POA: Diagnosis not present

## 2019-06-12 DIAGNOSIS — S0511XA Contusion of eyeball and orbital tissues, right eye, initial encounter: Secondary | ICD-10-CM

## 2019-06-12 DIAGNOSIS — N3000 Acute cystitis without hematuria: Secondary | ICD-10-CM

## 2019-06-12 DIAGNOSIS — S0011XA Contusion of right eyelid and periocular area, initial encounter: Secondary | ICD-10-CM | POA: Insufficient documentation

## 2019-06-12 DIAGNOSIS — T07XXXA Unspecified multiple injuries, initial encounter: Secondary | ICD-10-CM

## 2019-06-12 DIAGNOSIS — S61259A Open bite of unspecified finger without damage to nail, initial encounter: Secondary | ICD-10-CM

## 2019-06-12 DIAGNOSIS — F17210 Nicotine dependence, cigarettes, uncomplicated: Secondary | ICD-10-CM | POA: Insufficient documentation

## 2019-06-12 LAB — URINALYSIS, COMPLETE (UACMP) WITH MICROSCOPIC
Bilirubin Urine: NEGATIVE
Glucose, UA: NEGATIVE mg/dL
Ketones, ur: NEGATIVE mg/dL
Nitrite: NEGATIVE
Protein, ur: 30 mg/dL — AB
Specific Gravity, Urine: 1.014 (ref 1.005–1.030)
WBC, UA: >50 WBC/hpf — ABNORMAL HIGH (ref 0–5)
pH: 6 (ref 5.0–8.0)

## 2019-06-12 LAB — POCT PREGNANCY, URINE: Preg Test, Ur: NEGATIVE

## 2019-06-12 MED ORDER — HYDROXYZINE HCL 25 MG PO TABS: 25.0000 mg | ORAL_TABLET | Freq: Three times a day (TID) | ORAL | 0 refills | Status: AC | PRN

## 2019-06-12 MED ORDER — HYDROCODONE-ACETAMINOPHEN 5-325 MG PO TABS
1.0000 | ORAL_TABLET | Freq: Once | ORAL | Status: AC
  Administered 2019-06-12: 1 via ORAL
  Filled 2019-06-12: qty 1

## 2019-06-12 MED ORDER — HYDROCODONE-ACETAMINOPHEN 5-325 MG PO TABS: 1.0000 | ORAL_TABLET | Freq: Four times a day (QID) | ORAL | 0 refills | Status: AC | PRN

## 2019-06-12 MED ORDER — AMOXICILLIN-POT CLAVULANATE 875-125 MG PO TABS: 1.0000 | ORAL_TABLET | Freq: Two times a day (BID) | ORAL | 0 refills | Status: AC
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
## 2019-06-12 NOTE — ED Notes (Signed)
This RN to bedside to D/C patient, pt not visualized at bedside.

## 2019-06-12 NOTE — Discharge Instructions (Signed)
Return to the ER for concerns.  Please stay safe.
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
## 2019-09-29 IMAGING — US US OUTSIDE FILMS SOFT TISSUE NECK
1 series · 17 of 25 positions shown · non-contrast
Comparison: none

[Series 1: us soft tissue head and neck · 17 of 61 slices shown]
[im 1/61]
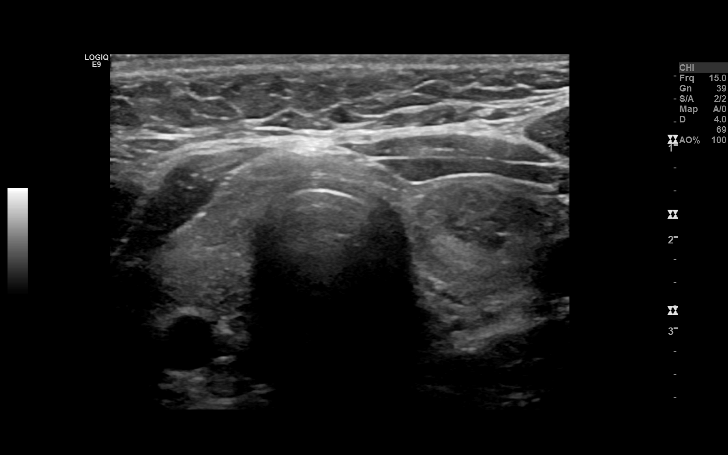
[im 6/61]
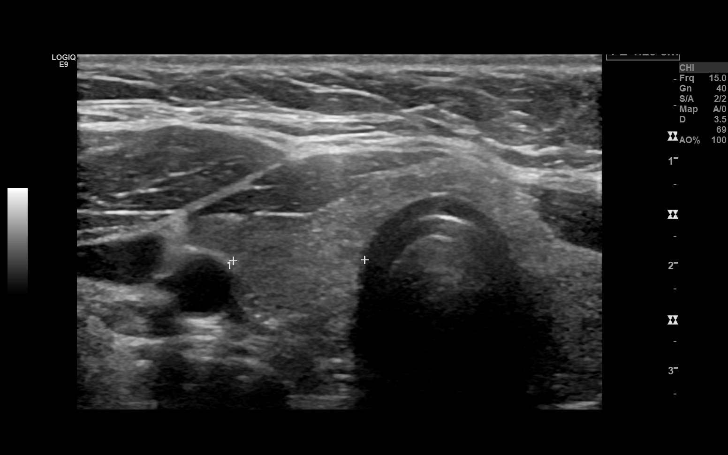
[im 8/61]
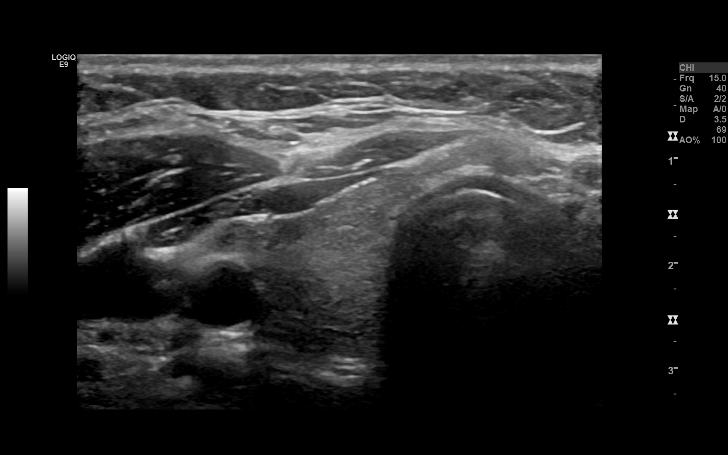
[im 13/61]
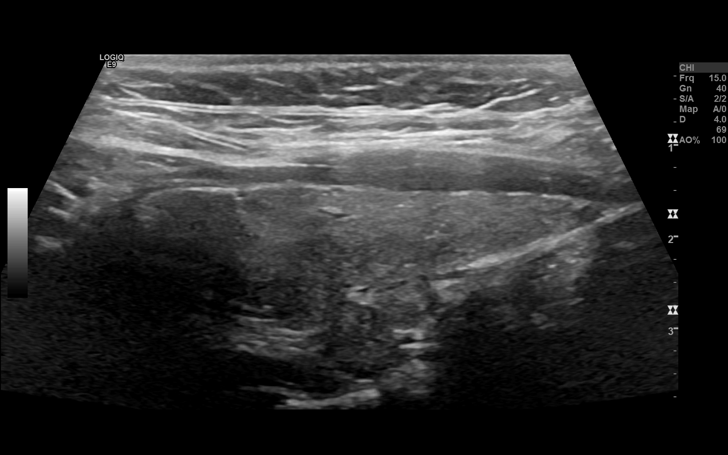
[im 16/61]
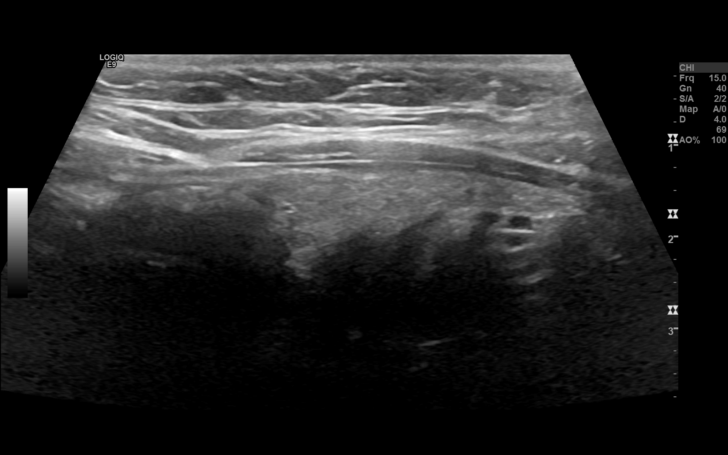
[im 21/61]
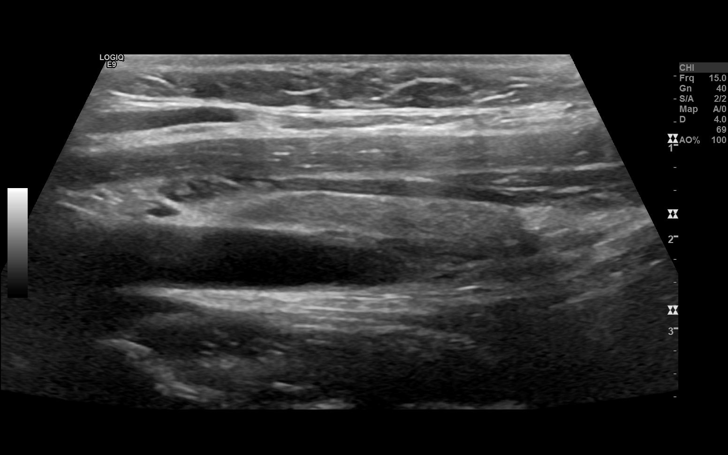
[im 23/61]
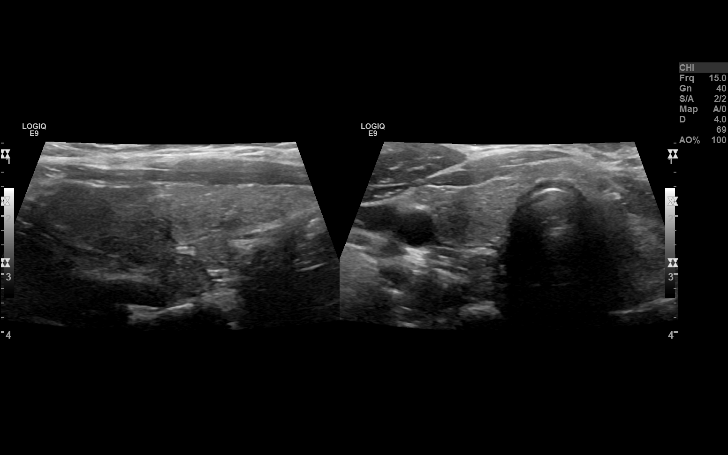
[im 28/61]
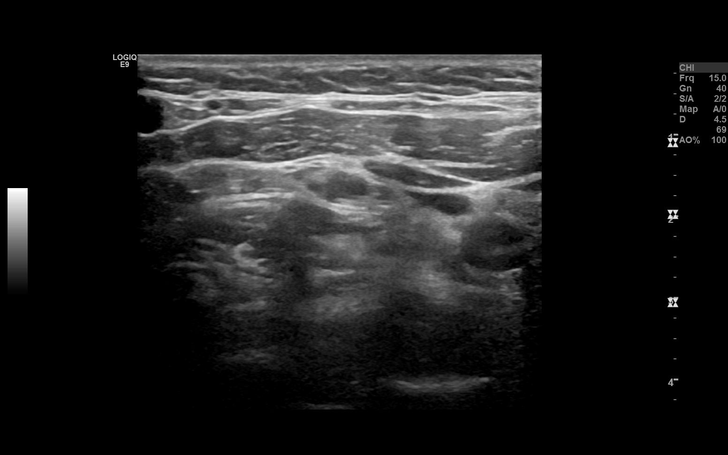
[im 31/61]
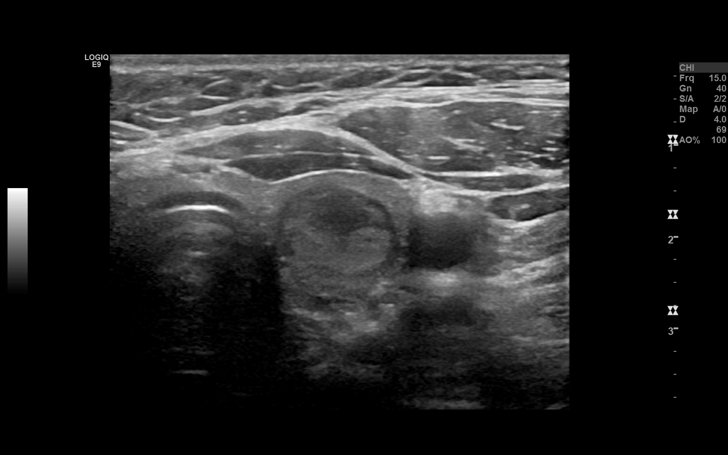
[im 33/61]
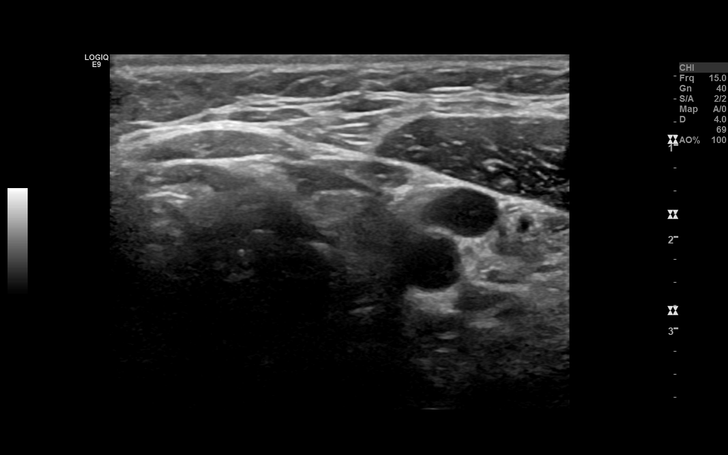
[im 38/61]
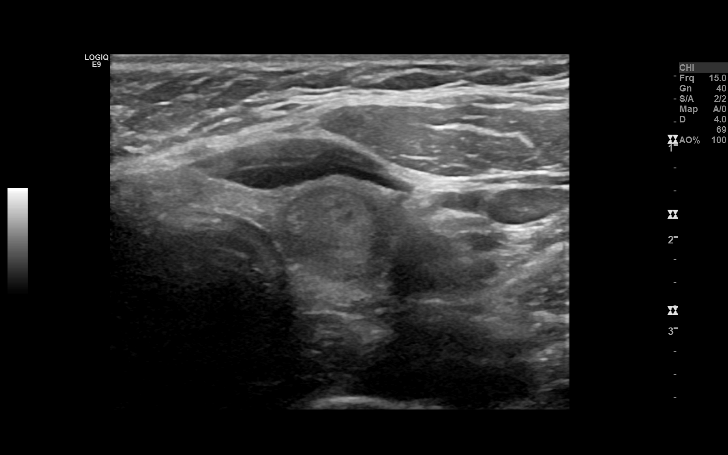
[im 41/61]
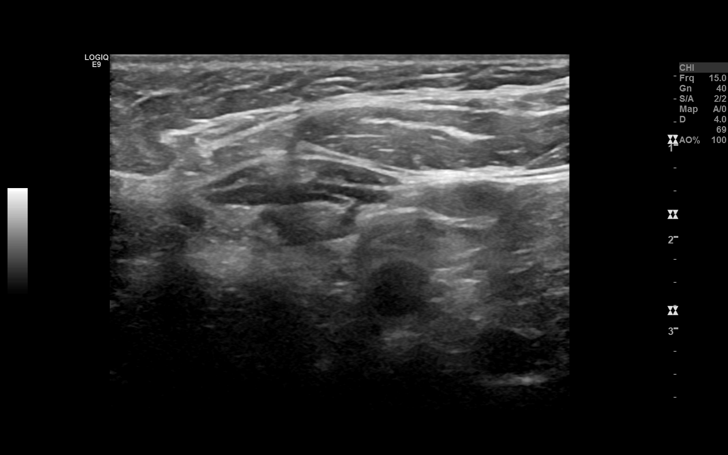
[im 46/61]
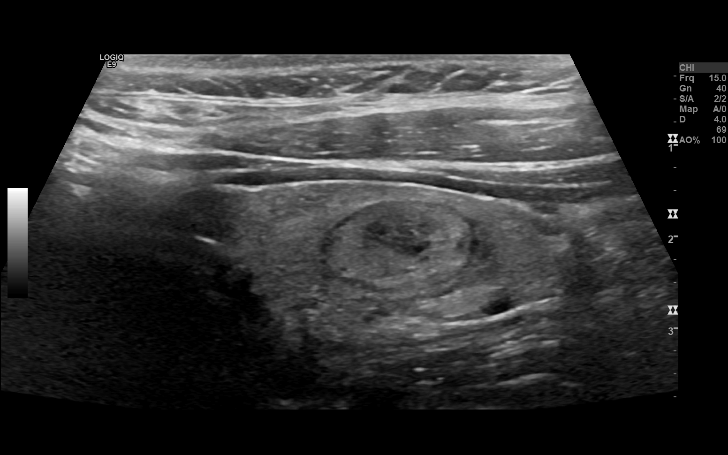
[im 48/61]
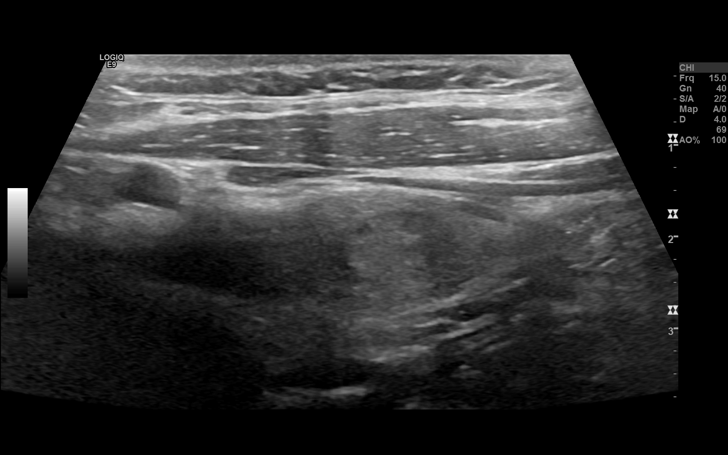
[im 53/61]
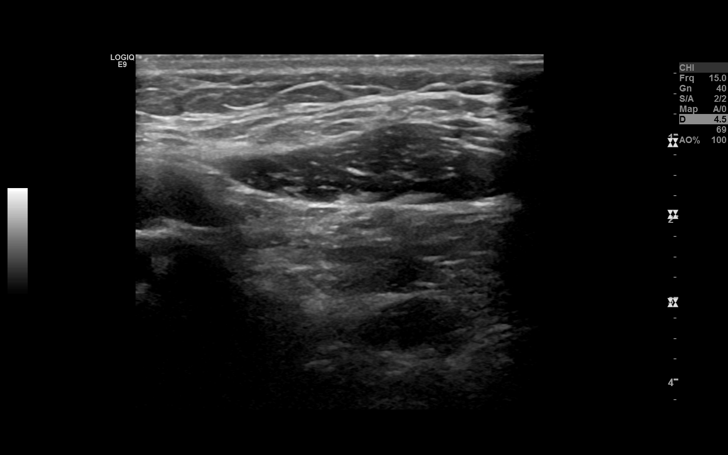
[im 56/61]
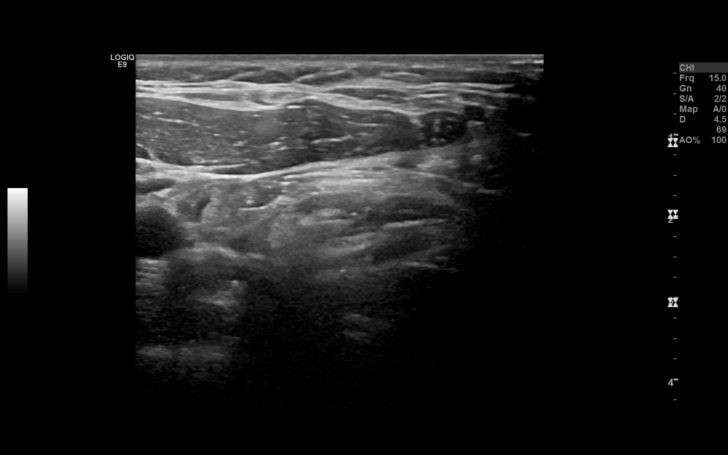
[im 61/61]
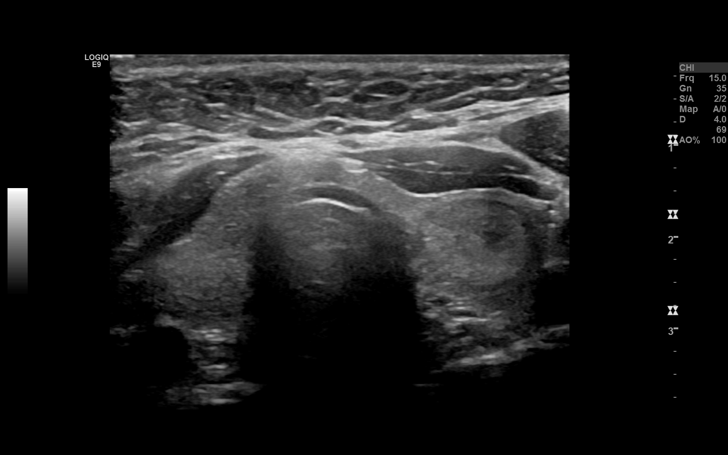

[17 of 25 positions shown; findings below may reference images not displayed]

Canned report from images found in remote index.

Refer to host system for actual result text.

## 2019-10-25 ENCOUNTER — Encounter (HOSPITAL_COMMUNITY): Payer: Self-pay | Admitting: Emergency Medicine

## 2019-10-25 ENCOUNTER — Emergency Department (HOSPITAL_COMMUNITY)
Admission: EM | Admit: 2019-10-25 | Discharge: 2019-10-25 | Disposition: A | Discharge status: Home / Self Care | Payer: Medicaid Other | Attending: Emergency Medicine | Admitting: Emergency Medicine

## 2019-10-25 DIAGNOSIS — T7840XA Allergy, unspecified, initial encounter: Secondary | ICD-10-CM | POA: Insufficient documentation

## 2019-10-25 DIAGNOSIS — Z5321 Procedure and treatment not carried out due to patient leaving prior to being seen by health care provider: Secondary | ICD-10-CM | POA: Diagnosis not present

## 2019-10-25 NOTE — ED Triage Notes (Signed)
Patient reports she colored her hair last week and had allergic reaction to dye. Initially started as rash to scalp. Now has swelling to eyes/forehead. Has taken Benadryl with no relief.
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
## 2021-08-06 ENCOUNTER — Emergency Department
Admission: EM | Admit: 2021-08-06 | Discharge: 2021-08-06 | Disposition: A | Discharge status: Home / Self Care | Payer: Medicaid Other | Attending: Student in an Organized Health Care Education/Training Program | Admitting: Student in an Organized Health Care Education/Training Program

## 2021-08-06 ENCOUNTER — Encounter: Payer: Self-pay | Admitting: Emergency Medicine

## 2021-08-06 ENCOUNTER — Other Ambulatory Visit: Payer: Self-pay

## 2021-08-06 DIAGNOSIS — S60454A Superficial foreign body of right ring finger, initial encounter: Secondary | ICD-10-CM | POA: Insufficient documentation

## 2021-08-06 DIAGNOSIS — W4904XA Ring or other jewelry causing external constriction, initial encounter: Secondary | ICD-10-CM | POA: Diagnosis not present

## 2021-08-06 NOTE — Discharge Instructions (Signed)
Ice and elevate hand to reduce swelling.  Tylenol or ibuprofen if needed for pain.  Follow-up with your primary care provider or urgent care if any continued problems. ?

## 2021-08-06 NOTE — ED Provider Notes (Signed)
? ?  [facility] ?Provider Note ? ? ? None  ?  (approximate) ? ? ?History  ? ?Finger Injury and Hand Pain ? ? ?HPI ? ?Raven Christensen is a 36 y.o. female presents to the ED with complaint of ring stuck on her right fourth finger for the last 2 days.  Patient has tried multiple ways of getting it off her finger without any relief. ?  ? ? ?Physical Exam  ? ?Triage Vital Signs: ?ED Triage Vitals [08/06/21 1339]  ?Enc Vitals Group  ?   BP 138/86  ?   Pulse Rate (!) 113  ?   Resp 20  ?   Temp 98.7 ?F (37.1 ?C)  ?   Temp Source Oral  ?   SpO2 100 %  ?   Weight 237 lb (107.5 kg)  ?   Height 5\' 6"  (1.676 m)  ?   Head Circumference   ?   Peak Flow   ?   Pain Score 3  ?   Pain Loc   ?   Pain Edu?   ?   Excl. in [place]?   ? ? ?Most recent vital signs: ?Vitals:  ? 08/06/21 1339  ?BP: 138/86  ?Pulse: (!) 113  ?Resp: 20  ?Temp: 98.7 ?F (37.1 ?C)  ?SpO2: 100%  ? ? ? ?General: Awake, no distress.  ?CV:  Good peripheral perfusion.  ?Resp:  Normal effort.  ?Abd:  No distention.  ?Other:  Right fourth finger with ring on.  Skin and soft tissue are edematous but no abrasions or cuts present.  Capillary refills less than 3 seconds.  Motor and sensory function intact. ? ? ?ED Results / Procedures / Treatments  ? ?Labs ?(all labs ordered are listed, but only abnormal results are displayed) ?Labs Reviewed - No data to display ? ? ? ? ?PROCEDURES: ? ?Critical Care performed:  ? ?Procedures ? ? ?MEDICATIONS ORDERED IN ED: ?Medications - No data to display ? ? ?IMPRESSION / MDM / ASSESSMENT AND PLAN / ED COURSE  ?I reviewed the triage vital signs and the nursing notes. ? ? ?Differential diagnosis includes, but is not limited to, ring stuck to right fourth finger. ? ?36 year old female presents to the ED with a ring stuck to her right fourth finger for the last 2 days.  She has tried multiple ways of getting it off with out success.  Ring was removed while in triage.  Patient is to ice and elevate as needed for swelling.   Tylenol or ibuprofen if needed for pain.  Follow-up with her PCP or urgent care if any continued problems. ? ? ? ?  ? ? ?FINAL CLINICAL IMPRESSION(S) / ED DIAGNOSES  ? ?Final diagnoses:  ?Foreign body of right ring finger  ? ? ? ?Rx / DC Orders  ? ?ED Discharge Orders   ? ? None  ? ?  ? ? ? ?Note:  This document was prepared using Dragon voice recognition software and may include unintentional dictation errors. ?  ?Johnn Hai, PA-C ?08/06/21 1438 ? ?  ?Merlyn Lot, MD ?08/06/21 1519 ? ?

## 2021-08-06 NOTE — ED Provider Triage Note (Signed)
Emergency Medicine Provider Triage Evaluation Note ? ?Raven Christensen , a 36 y.o. female  was evaluated in triage.  Pt complains of left fourth finger with ring stuck x 2 days.  Has tried to get it off at home.   ? ?Review of Systems  ?Positive: + sensation, + cap refill ?Negative: Finger swollen ? ?Physical Exam  ?BP 138/86 (BP Location: Left Arm)   Pulse (!) 113   Temp 98.7 ?F (37.1 ?C) (Oral)   Resp 20   Ht 5\' 6"  (1.676 m)   Wt 104.3 kg   LMP 07/23/2021 (Exact Date)   SpO2 100%   BMI 37.12 kg/m?  ?Gen:   Awake, no distress   ?Resp:  Normal effort  ?MSK:   Moves extremities without difficulty  ?Other:  Left 4th finger with ring on.  Soft tissue intact.  Cap refill +, motor  sensory function intact.   ? ?Medical Decision Making  ?Medically screening exam initiated at 1:41 PM.  Appropriate orders placed.  Raven Christensen was informed that the remainder of the evaluation will be completed by another provider, this initial triage assessment does not replace that evaluation, and the importance of remaining in the ED until their evaluation is complete. ? ? ?  ?Johnn Hai, PA-C ?08/06/21 1346 ? ?

## 2021-08-06 NOTE — ED Triage Notes (Signed)
Pt has ring to right 4th finger that she cannot remove. No injury to finger but now has swelling and redness around ring.  ?

## 2021-08-06 NOTE — ED Triage Notes (Signed)
Pt via POV from home. Pt has her ring stunk on her R ring finger for the past 2-3 days. Denies numbness and mild pain. Swelling noted to that finger. Pt is A&Ox4 and NAD ?

## 2021-09-09 ENCOUNTER — Other Ambulatory Visit: Payer: Self-pay

## 2021-09-09 ENCOUNTER — Emergency Department (HOSPITAL_COMMUNITY)
Admission: EM | Admit: 2021-09-09 | Discharge: 2021-09-09 | Discharge status: Against Medical Advice | Payer: Medicaid Other | Attending: Emergency Medicine | Admitting: Emergency Medicine

## 2021-09-09 ENCOUNTER — Encounter (HOSPITAL_COMMUNITY): Payer: Self-pay | Admitting: Emergency Medicine

## 2021-09-09 DIAGNOSIS — R2243 Localized swelling, mass and lump, lower limb, bilateral: Secondary | ICD-10-CM | POA: Insufficient documentation

## 2021-09-09 DIAGNOSIS — Z5321 Procedure and treatment not carried out due to patient leaving prior to being seen by health care provider: Secondary | ICD-10-CM | POA: Insufficient documentation

## 2021-09-09 NOTE — ED Triage Notes (Signed)
Pt c/o bilateral leg swelling for a while but redness started tonight. ?

## 2022-10-22 ENCOUNTER — Emergency Department (HOSPITAL_COMMUNITY)
Admission: EM | Admit: 2022-10-22 | Discharge: 2022-10-22 | Disposition: A | Discharge status: Home / Self Care | Payer: 59 | Attending: Emergency Medicine | Admitting: Emergency Medicine

## 2022-10-22 ENCOUNTER — Encounter (HOSPITAL_COMMUNITY): Payer: Self-pay

## 2022-10-22 ENCOUNTER — Other Ambulatory Visit: Payer: Self-pay

## 2022-10-22 DIAGNOSIS — K0889 Other specified disorders of teeth and supporting structures: Secondary | ICD-10-CM | POA: Insufficient documentation

## 2022-10-22 MED ORDER — KETOROLAC TROMETHAMINE 15 MG/ML IJ SOLN
15.0000 mg | Freq: Once | INTRAMUSCULAR | Status: AC
  Administered 2022-10-22: 15 mg via INTRAMUSCULAR
  Filled 2022-10-22: qty 1

## 2022-10-22 NOTE — ED Provider Notes (Signed)
[place] EMERGENCY DEPARTMENT AT [facility] Provider Note   CSN: [id number] Arrival date & time: 10/22/22  7829     History  Chief Complaint  Patient presents with   Dental Pain    Raven Christensen is a 37 y.o. female.  37 year old female presents today for dental pain.  Started about 2 to 3 days ago.  She states she had a decayed tooth which broke off while she was eating.  Is tearful during exam.  No fever no chills.  Does not have established dental care.  Has taken Tylenol ibuprofen without significant improvement in pain.  The history is provided by the patient. No language interpreter was used.       Home Medications Prior to Admission medications   Medication Sig Start Date End Date Taking? Authorizing Provider  albuterol (PROVENTIL HFA;VENTOLIN HFA) 108 (90 Base) MCG/ACT inhaler Inhale 1-2 puffs into the lungs every 4 (four) hours as needed for wheezing or shortness of breath.    [provider]  amoxicillin-clavulanate (AUGMENTIN) 875-125 MG tablet Take 1 tablet by mouth 2 (two) times daily. 06/12/19   Triplett, Cari B, FNP  gabapentin (NEURONTIN) 300 MG capsule Take 300 mg by mouth at bedtime.    [provider]  hydrochlorothiazide (MICROZIDE) 12.5 MG capsule Take 12.5 mg by mouth daily.    [provider]  hydrOXYzine (ATARAX/VISTARIL) 25 MG tablet Take 1 tablet (25 mg total) by mouth 3 (three) times daily as needed for anxiety. 06/12/19   Triplett, Cari B, FNP  topiramate (TOPAMAX) 100 MG tablet Take 100 mg by mouth daily.    [provider]      Allergies    Patient has no known allergies.    Review of Systems   Review of Systems  Constitutional:  Negative for fever.  HENT:  Positive for dental problem. Negative for drooling and trouble swallowing.   All other systems reviewed and are negative.   Physical Exam Updated Vital Signs BP (!) 147/108 (BP Location: Left Arm)   Pulse (!) 121   Temp 98.3 F (36.8  C) (Oral)   Resp 20   Ht 5\' 6"  (1.676 m)   Wt 97.5 kg   SpO2 100%   BMI 34.69 kg/m  Physical Exam Vitals and nursing note reviewed.  Constitutional:      General: She is not in acute distress.    Appearance: Normal appearance. She is not ill-appearing.  HENT:     Head: Normocephalic and atraumatic.     Nose: Nose normal.     Mouth/Throat:     Comments: Overall poor dentition.  No evidence of peritonsillar abscess, Ludwig's angina, retropharyngeal abscess.  No trismus.  Fractured tooth noted.   Eyes:     Conjunctiva/sclera: Conjunctivae normal.  Pulmonary:     Effort: Pulmonary effort is normal. No respiratory distress.  Musculoskeletal:        General: No deformity.  Skin:    Findings: No rash.  Neurological:     Mental Status: She is alert.     ED Results / Procedures / Treatments   Labs (all labs ordered are listed, but only abnormal results are displayed) Labs Reviewed - No data to display  EKG None  Radiology No results found.  Procedures Procedures    Medications Ordered in ED Medications - No data to display  ED Course/ Medical Decision Making/ A&P  Medical Decision Making  37 year old female presents today for dental pain.  Has a fractured tooth from 2 to 3 days ago.  Tearful during interview.  Fractured tooth noted on exam.  No other concerning findings on exam.  Overall poor dentition.  Dental blocks not available in the emergency department.  Dermabond used to provide comfort/protection over the tooth over the exposed enamel.  Immediate relief noted.  Toradol shot given.  Dental resources as well as on-call dentist information provided.  Patient is appropriate for discharge.  Discharged in stable condition peer return precaution discussed.  Patient voices understanding and is in agreement with plan.   Final Clinical Impression(s) / ED Diagnoses Final diagnoses:  Pain, dental    Rx / DC Orders ED Discharge Orders      None         Marita Kansas, PA-C 10/22/22 1019    Tegeler, Canary Brim, MD 10/22/22 1335

## 2022-10-22 NOTE — Discharge Instructions (Addendum)
Infected tooth was covered using Dermabond.  Follow-up with the dentist as this will fall off in the next 2 to 3 days.  Have attached information regarding dental clinics as well as our on-call dentist.  Please call to schedule this appointment as soon as you can.  Return for any concerning symptoms.

## 2022-10-22 NOTE — ED Triage Notes (Signed)
Pt reports dental pain x2 days, does not have a dentist. Denies fever, chills or any other symptoms.
